# Patient Record
Sex: Male | Born: 1970 | Race: White | Hispanic: No | Marital: Single | Smoking: Former smoker
Health system: Southern US, Community
[De-identification: ages and names within clinical notes are randomized; demographics above are authoritative.]

---

## 2007-05-19 ENCOUNTER — Emergency Department (HOSPITAL_COMMUNITY): Admission: EM | Admit: 2007-05-19 | Discharge: 2007-05-20 | Payer: Self-pay | Admitting: Emergency Medicine

## 2007-05-31 ENCOUNTER — Ambulatory Visit: Payer: Self-pay | Admitting: *Deleted

## 2007-05-31 ENCOUNTER — Emergency Department (HOSPITAL_COMMUNITY): Admission: EM | Admit: 2007-05-31 | Discharge: 2007-05-31 | Payer: Self-pay | Admitting: Emergency Medicine

## 2007-05-31 ENCOUNTER — Ambulatory Visit: Admission: RE | Admit: 2007-05-31 | Discharge: 2007-05-31 | Payer: Self-pay | Admitting: Family Medicine

## 2008-12-02 ENCOUNTER — Inpatient Hospital Stay (HOSPITAL_COMMUNITY): Admission: EM | Admit: 2008-12-02 | Discharge: 2008-12-05 | Payer: Self-pay | Admitting: Family Medicine

## 2008-12-03 ENCOUNTER — Encounter (INDEPENDENT_AMBULATORY_CARE_PROVIDER_SITE_OTHER): Payer: Self-pay | Admitting: Internal Medicine

## 2008-12-03 ENCOUNTER — Ambulatory Visit: Payer: Self-pay | Admitting: Vascular Surgery

## 2011-03-21 LAB — DIFFERENTIAL
Basophils Absolute: 0.1 10*3/uL (ref 0.0–0.1)
Basophils Relative: 1 % (ref 0–1)
Eosinophils Absolute: 0.2 10*3/uL (ref 0.0–0.7)
Eosinophils Relative: 3 % (ref 0–5)
Lymphocytes Relative: 30 % (ref 12–46)
Lymphs Abs: 2.4 10*3/uL (ref 0.7–4.0)
Monocytes Absolute: 0.7 10*3/uL (ref 0.1–1.0)
Monocytes Relative: 8 % (ref 3–12)
Neutro Abs: 4.5 10*3/uL (ref 1.7–7.7)
Neutrophils Relative %: 58 % (ref 43–77)

## 2011-03-21 LAB — CBC
HCT: 39 % (ref 39.0–52.0)
Hemoglobin: 13.1 g/dL (ref 13.0–17.0)
MCHC: 33.5 g/dL (ref 30.0–36.0)
MCV: 93.7 fL (ref 78.0–100.0)
Platelets: 294 10*3/uL (ref 150–400)
RBC: 4.17 MIL/uL — ABNORMAL LOW (ref 4.22–5.81)
RDW: 12.2 % (ref 11.5–15.5)
WBC: 7.8 10*3/uL (ref 4.0–10.5)

## 2011-04-19 NOTE — Discharge Summary (Signed)
Richard Leonard, Richard Leonard        ACCOUNT NO.:  [id number]   MEDICAL RECORD NO.:  [id number]          PATIENT TYPE:  INP   LOCATION:  3006                         FACILITY:  [facility]   PHYSICIAN:  Charlestine Massed, MDDATE OF BIRTH:  Dec 17, 1970   DATE OF ADMISSION:  12/02/2008  DATE OF DISCHARGE:  12/05/2008                               DISCHARGE SUMMARY   PRIMARY CARE PHYSICIAN:  Unassigned to follow up with Dr. Dow Adolph at [place], [place].   REASON FOR ADMISSION:  Right lower leg rash with swelling.   HOSPITAL COURSE:  A 40 year old gentleman with no significant past  medical history and who recently has lost his health insurance came to  the emergency room complaining of a rash in the right lower leg.  It  started as a small rash with itching, and then developed all over the  right leg then associated with increasing swelling over the past few  days and warmth and pain.  On examination the patient denies having any  trauma on the leg.  Denies having any injuries, any procedures on the  leg.  Denied any fever, chest pain, shortness of breath, cough or sputum  production.  No chills, no night sweats.  There is no prior history of  any IV drug abuse suspected, and patient denied that.  He denied any  insect bite at that site too, and previously did not have any previous  episodes like this before.  The patient was admitted to the hospital.  Started on IV antibiotics, and examination revealed a diffuse rash all  over the right leg associated with swelling, and there was cellulitis in  the right lower extremity.  Rash looked like an ectopic dermatitis or  eczema kind of rash.  The patient was started on antihistamines as well  as IV antibiotics were switched over to p.o. antibiotics.  The patient  improved gradually.  No fevers, and pruritus decreased much.  After that  her blood cultures sent did not grow any organisms.  Switched over to  p.o. antibiotics, and  patient is being discharged home.   DISCHARGE DIAGNOSES:  1. Right lower leg cellulitis.  2. Possible dermatitis secondary to allergy or eczema.   DISCHARGE MEDICATIONS:  1. Augmentin 875 mg twice daily by mouth for 7 days.  2. Bactrim DS 1 tablet twice daily by mouth for 7 days.  3. Claritin 10 mg by mouth once daily for 14 days.  4. Daily dressing to the right lower extremity once daily.  5. Topical Neosporin ointment to the lower extremity twice daily.   CONSULTS DURING HOSPITAL STAY:  Wound care consult for advice was done.   ALLERGIES:  The patient does not have any drug allergies.   LABORATORIES AND TESTS:  The blood count on 1st of January 2010:  WBC  7.8, hemoglobin 13.1, hematocrit 39, and platelets 294.  There are no  bands.  BMP done on December 30:  Sodium 142, potassium 3.9, chloride  109, bicarb 28, glucose 96, BUN 6, creatinine 0.84.  Wound culture done  on December 29:  No wbc's seen, no organisms seen.  Cultures showed  moderate  Streptococcal group G.  Gram smear did not show any organisms.  Culture showed moderate Streptococcal group B and blood group G.  Blood  cultures both sets:  No organisms grown in cultures until today per  report.   DISCHARGE DISPOSITION:  Discharge back home.   FOLLOWUP:  Dr. Dow Adolph at [facility], phone number [phone].  Appointment fixed for January 06, 2009 at 8:45 a.m.  A dermatology  consult to be done at the discretion of Dr. Audria Nine at [facility];  therefore to be made by [facility] physician.   Total time spent for the discharge 30 minutes.      Charlestine Massed, MD  Electronically Signed     UT/MEDQ  D:  12/05/2008  T:  12/05/2008  Job:  [id number]   cc:   Maurice March, M.D.

## 2011-04-19 NOTE — H&P (Signed)
NAMELIONARDO, HAZE        ACCOUNT NO.:  [id number]   MEDICAL RECORD NO.:  [id number]          PATIENT TYPE:  INP   LOCATION:  3708                         FACILITY:  [facility]   PHYSICIAN:  Della Goo, M.D. DATE OF BIRTH:  1971-03-26   DATE OF ADMISSION:  12/02/2008  DATE OF DISCHARGE:                              HISTORY & PHYSICAL   PRIMARY CARE PHYSICIAN:  No unassigned   CHIEF COMPLAINT:  Right lower leg rash.   HISTORY OF PRESENT ILLNESS:  This is a 40 year old male presenting to  the emergency department with complaints of worsening of a rash on the  right lower leg.  He reports the rash began 2 months ago and has  continued to worsen.  He reports that the area started as a blistery  area.  He denies having any purulent drainage from this area.  He does  report at times it is pruritic.  He denies having any trauma to the area  or recalling an insect bite.  The patient denies having any previous  similar episodes to this.   PAST MEDICAL HISTORY:  None.   PAST SURGICAL HISTORY:  None.   ALLERGIES:  NO KNOWN DRUG ALLERGIES.   SOCIAL HISTORY:  The patient is a nonsmoker and nondrinker.   FAMILY HISTORY:  Noncontributory.   REVIEW OF SYSTEMS:  The patient denies having any fevers, chills, chest  pain, shortness of breath, cough, congestion, nausea, vomiting,  diarrhea, changes in his bowel or bladder habits, syncope, dizziness,  myalgias or arthralgias.   PHYSICAL EXAMINATION:  GENERAL:  This is a 40 year old overweight, well-  developed male in no discomfort or acute distress.  VITAL SIGNS:  Temperature 93, blood pressure 143/78, heart rate 84, respirations 16.  O2 saturation 94% on room air.  HEENT:  Normocephalic, atraumatic.  Pupils equally round and reactive to  light.  Extraocular movements are intact.  Funduscopic benign.  Oropharynx is clear.  NECK:  Supple, full range of motion.  No thyromegaly, adenopathy or  jugulovenous distention.   CARDIOVASCULAR:  Regular rate and rhythm.  No  murmurs, gallops or rubs.  LUNGS:  Clear to auscultation bilaterally.  ABDOMEN:  Positive bowel sounds.  Soft, nontender, nondistended.  EXTREMITIES:  The left lower extremity is without cyanosis, clubbing or  edema.  The right lower extremity at the distal aspect of the lower  portion of the right lower extremity distal third is a streaking  erythematous rash.  NEUROLOGIC:  Nonfocal.   LABORATORY STUDIES:  White blood cell count 9.0, hemoglobin 13.2,  hematocrit 39.1 and platelets 289.  Sodium 141, potassium 4.3, chloride  108, bicarb 24, BUN 21, creatinine 0.8, glucose 90.   ASSESSMENT:  A 40 year old male being admitted with cellulitis of the  right lower extremity.   PLAN:  The patient will be placed on IV antibiotic therapy of vancomycin  and Ancef.  DVT and GI prophylaxis will be ordered.  Also, the patient  will be placed on pain control therapy as needed.  Further evaluation  and workup will ensue pending results of his laboratory studies and his  clinical course.      Harvette  Lovell Sheehan, M.D.  Electronically Signed     HJ/MEDQ  D:  12/03/2008  T:  12/03/2008  Job:  [id number]

## 2011-09-09 LAB — CBC
HCT: 37.3 % — ABNORMAL LOW (ref 39.0–52.0)
HCT: 38.5 % — ABNORMAL LOW (ref 39.0–52.0)
HCT: 39.1 % (ref 39.0–52.0)
Hemoglobin: 12.7 g/dL — ABNORMAL LOW (ref 13.0–17.0)
Hemoglobin: 13 g/dL (ref 13.0–17.0)
Hemoglobin: 13.2 g/dL (ref 13.0–17.0)
MCHC: 33.7 g/dL (ref 30.0–36.0)
MCHC: 33.8 g/dL (ref 30.0–36.0)
MCHC: 34 g/dL (ref 30.0–36.0)
MCV: 94.2 fL (ref 78.0–100.0)
MCV: 94.5 fL (ref 78.0–100.0)
MCV: 95 fL (ref 78.0–100.0)
Platelets: 259 10*3/uL (ref 150–400)
Platelets: 282 10*3/uL (ref 150–400)
Platelets: 289 10*3/uL (ref 150–400)
RBC: 3.95 MIL/uL — ABNORMAL LOW (ref 4.22–5.81)
RBC: 4.06 MIL/uL — ABNORMAL LOW (ref 4.22–5.81)
RBC: 4.15 MIL/uL — ABNORMAL LOW (ref 4.22–5.81)
RDW: 12 % (ref 11.5–15.5)
RDW: 12.2 % (ref 11.5–15.5)
RDW: 12.2 % (ref 11.5–15.5)
WBC: 6.8 10*3/uL (ref 4.0–10.5)
WBC: 8.9 10*3/uL (ref 4.0–10.5)
WBC: 9 10*3/uL (ref 4.0–10.5)

## 2011-09-09 LAB — DIFFERENTIAL
Basophils Absolute: 0 10*3/uL (ref 0.0–0.1)
Basophils Absolute: 0 10*3/uL (ref 0.0–0.1)
Basophils Relative: 0 % (ref 0–1)
Basophils Relative: 1 % (ref 0–1)
Eosinophils Absolute: 0.2 10*3/uL (ref 0.0–0.7)
Eosinophils Absolute: 0.3 10*3/uL (ref 0.0–0.7)
Eosinophils Relative: 3 % (ref 0–5)
Eosinophils Relative: 3 % (ref 0–5)
Lymphocytes Relative: 18 % (ref 12–46)
Lymphocytes Relative: 28 % (ref 12–46)
Lymphs Abs: 1.6 10*3/uL (ref 0.7–4.0)
Lymphs Abs: 2.5 10*3/uL (ref 0.7–4.0)
Monocytes Absolute: 0.6 10*3/uL (ref 0.1–1.0)
Monocytes Absolute: 0.7 10*3/uL (ref 0.1–1.0)
Monocytes Relative: 7 % (ref 3–12)
Monocytes Relative: 8 % (ref 3–12)
Neutro Abs: 5.5 10*3/uL (ref 1.7–7.7)
Neutro Abs: 6.4 10*3/uL (ref 1.7–7.7)
Neutrophils Relative %: 62 % (ref 43–77)
Neutrophils Relative %: 72 % (ref 43–77)

## 2011-09-09 LAB — BASIC METABOLIC PANEL
BUN: 18 mg/dL (ref 6–23)
BUN: 6 mg/dL (ref 6–23)
CO2: 25 mEq/L (ref 19–32)
CO2: 28 mEq/L (ref 19–32)
Calcium: 8.5 mg/dL (ref 8.4–10.5)
Calcium: 8.6 mg/dL (ref 8.4–10.5)
Chloride: 109 mEq/L (ref 96–112)
Chloride: 112 mEq/L (ref 96–112)
Creatinine, Ser: 0.71 mg/dL (ref 0.4–1.5)
Creatinine, Ser: 0.84 mg/dL (ref 0.4–1.5)
GFR calc Af Amer: >60 mL/min (ref >60)
GFR calc Af Amer: >60 mL/min (ref >60)
GFR calc non Af Amer: >60 mL/min (ref >60)
GFR calc non Af Amer: >60 mL/min (ref >60)
Glucose, Bld: 87 mg/dL (ref 70–99)
Glucose, Bld: 96 mg/dL (ref 70–99)
Potassium: 3.9 mEq/L (ref 3.5–5.1)
Potassium: 3.9 mEq/L (ref 3.5–5.1)
Sodium: 141 mEq/L (ref 135–145)
Sodium: 142 mEq/L (ref 135–145)

## 2011-09-09 LAB — CULTURE, BLOOD (ROUTINE X 2)
Culture: NO GROWTH
Culture: NO GROWTH

## 2011-09-09 LAB — WOUND CULTURE: Gram Stain: NONE SEEN

## 2011-09-09 LAB — D-DIMER, QUANTITATIVE: D-Dimer, Quant: 0.68 ug/mL-FEU — ABNORMAL HIGH (ref 0.00–0.48)

## 2011-09-09 LAB — POCT I-STAT, CHEM 8
BUN: 21 mg/dL (ref 6–23)
Calcium, Ion: 1.06 mmol/L — ABNORMAL LOW (ref 1.12–1.32)
Chloride: 108 mEq/L (ref 96–112)
Creatinine, Ser: 0.8 mg/dL (ref 0.4–1.5)
Glucose, Bld: 90 mg/dL (ref 70–99)
HCT: 37 % — ABNORMAL LOW (ref 39.0–52.0)
Hemoglobin: 12.6 g/dL — ABNORMAL LOW (ref 13.0–17.0)
Potassium: 4.3 mEq/L (ref 3.5–5.1)
Sodium: 141 mEq/L (ref 135–145)
TCO2: 24 mmol/L (ref 0–100)

## 2011-09-09 LAB — TSH: TSH: 2.216 u[IU]/mL (ref 0.350–4.500)

## 2011-09-21 LAB — BASIC METABOLIC PANEL
BUN: 15
CO2: 26
Calcium: 9.2
Chloride: 105
Creatinine, Ser: 0.99
GFR calc Af Amer: >60
GFR calc non Af Amer: >60
Glucose, Bld: 102 — ABNORMAL HIGH
Potassium: 3.7
Sodium: 141

## 2011-09-21 LAB — D-DIMER, QUANTITATIVE: D-Dimer, Quant: 0.3

## 2021-01-20 ENCOUNTER — Other Ambulatory Visit: Payer: Self-pay | Admitting: Internal Medicine

## 2021-01-20 DIAGNOSIS — Z8249 Family history of ischemic heart disease and other diseases of the circulatory system: Secondary | ICD-10-CM

## 2021-01-25 ENCOUNTER — Ambulatory Visit (INDEPENDENT_AMBULATORY_CARE_PROVIDER_SITE_OTHER): Payer: 59

## 2021-01-25 ENCOUNTER — Other Ambulatory Visit: Payer: Self-pay

## 2021-01-25 ENCOUNTER — Ambulatory Visit (INDEPENDENT_AMBULATORY_CARE_PROVIDER_SITE_OTHER): Payer: 59 | Admitting: Family Medicine

## 2021-01-25 ENCOUNTER — Ambulatory Visit: Payer: Self-pay

## 2021-01-25 VITALS — BP 130/84 | HR 81 | Ht 66.0 in | Wt 237.0 lb

## 2021-01-25 DIAGNOSIS — M542 Cervicalgia: Secondary | ICD-10-CM | POA: Diagnosis not present

## 2021-01-25 DIAGNOSIS — G8929 Other chronic pain: Secondary | ICD-10-CM

## 2021-01-25 DIAGNOSIS — M25512 Pain in left shoulder: Secondary | ICD-10-CM | POA: Diagnosis not present

## 2021-01-25 NOTE — Progress Notes (Signed)
I, [name], LAT, ATC, am serving as scribe for Dr. [name].  Subjective:    CC: Left shoulder pain  HPI: Pt is a 50 y/o male c/o chronic L shoulder pain since 2005/06 when he injured himself while doing bench press.  He did not not have his shoulder evaluated at that time.  Pt locates pain to his L post shoulder w/ radiating pain into his L upper trap and neck.  Radiates: yes into his L upper trap and L lateral neck L shoulder mechanical symptoms: No Aggravates: end-range L shoulder AROM; push-ups; planks; L shoulder functional IR; L cervical rotation Treatments tried: Nothing  Pertinent review of Systems: No fevers or chills  Relevant historical information: Thoracolumbar fusion secondary to scoliosis surgery   Objective:    Vitals:   01/25/21 1440  BP: 130/84  Pulse: 81  SpO2: 96%   General: Well Developed, well nourished, and in no acute distress.   MSK: C-spine normal-appearing Significantly reduced cervical motion. Nontender midline. Tender palpation left cervical paraspinal musculature and trapezius. Upper extremity strength reflexes and sensation are equal normal throughout bilateral upper extremities Left shoulder normal-appearing Nontender. Normal motion. Strength is intact. Negative Hawkins and Neer's test.  Negative Yergason's and speeds test.  Lab and Radiology Results  X-ray images C-spine and left shoulder obtained today personally and independently interpreted  C-spine: Significant DDD C5-T1.  Thoracic spine hardware is intact no acute fractures.  Left shoulder: Mild glenohumeral DJD.  Mild AC DJD.  No acute fractures.  T-spine hardware intact  Await formal radiology review   Impression and Recommendations:    Assessment and Plan: 50 y.o. male with cervical paraspinal and trapezius pain.  Mostly muscle dysfunction and spasm however patient does certainly have a degenerative changes in his cervical spine that could be contributory.   I do not think the pain is related much to shoulder dysfunction itself.  Patient is a good candidate for physical therapy.  Plan for referral to PT.  Recheck back in about 6 weeks.  PDMP not reviewed this encounter. Orders Placed This Encounter  Procedures  . DG Cervical Spine 2 or 3 views    Standing Status:   Future    Number of Occurrences:   1    Standing Expiration Date:   01/25/2022    Order Specific Question:   Reason for Exam (SYMPTOM  OR DIAGNOSIS REQUIRED)    Answer:   eval neck pain.    Order Specific Question:   Preferred imaging location?    Answer:   [facility]  . DG Shoulder Left    Standing Status:   Future    Number of Occurrences:   1    Standing Expiration Date:   01/25/2022    Order Specific Question:   Reason for Exam (SYMPTOM  OR DIAGNOSIS REQUIRED)    Answer:   eval left shoudler pain    Order Specific Question:   Preferred imaging location?    Answer:   [facility]  . Ambulatory referral to Physical Therapy    Referral Priority:   Routine    Referral Type:   Physical Medicine    Referral Reason:   Specialty Services Required    Requested Specialty:   Physical Therapy   No orders of the defined types were placed in this encounter.   Discussed warning signs or symptoms. Please see discharge instructions. Patient expresses understanding.   The above documentation has been reviewed and is accurate and complete  Lynne Leader, M.D.

## 2021-01-25 NOTE — Patient Instructions (Signed)
Thank you for coming in today.  I've referred you to Physical Therapy.  Let us know if you don't hear from them in one week.  Please get an Xray today before you leave  Recheck in about 6 weeks.   Let me know if this is not working or you have a problem.

## 2021-01-27 NOTE — Progress Notes (Signed)
X-ray cervical spine shows a mild wedge shaped at the C7 vertebrae.  This could be due to an old compression fracture or because that is just how that vertebrae was made.  It does not look like a new compression fracture however and I do not think is causing new pain.  Additionally x-ray cervical spine shows arthritis.  Physical therapy is still the best option.  If not better will proceed with MRI which will tell us more.

## 2021-01-27 NOTE — Progress Notes (Signed)
X-ray left shoulder shows mild arthritis

## 2021-02-04 ENCOUNTER — Encounter: Payer: Self-pay | Admitting: Physical Therapy

## 2021-02-04 ENCOUNTER — Other Ambulatory Visit: Payer: Self-pay

## 2021-02-04 ENCOUNTER — Ambulatory Visit (INDEPENDENT_AMBULATORY_CARE_PROVIDER_SITE_OTHER): Payer: 59 | Admitting: Physical Therapy

## 2021-02-04 DIAGNOSIS — M542 Cervicalgia: Secondary | ICD-10-CM | POA: Diagnosis not present

## 2021-02-04 DIAGNOSIS — M25512 Pain in left shoulder: Secondary | ICD-10-CM

## 2021-02-04 DIAGNOSIS — R293 Abnormal posture: Secondary | ICD-10-CM

## 2021-02-04 NOTE — Therapy (Signed)
[facility] [address] [place], [place], [address] Phone: [phone]   Fax:  [phone]  Physical Therapy Evaluation  Patient Details  Name: Richard Leonard MRN: [id number] Date of Birth: [birth year] Referring Provider (PT): Rodolph Bong, MD   Encounter Date: 02/04/2021   PT End of Session - 02/04/21 1543    Visit Number 1    Number of Visits 12    Date for PT Re-Evaluation 03/18/21    Authorization Type [employer] 20% coinsurance    PT Start Time 1503    PT Stop Time 1542    PT Time Calculation (min) 39 min    Activity Tolerance Patient tolerated treatment well    Behavior During Therapy [facility] for tasks assessed/performed           History reviewed. No pertinent past medical history.  History reviewed. No pertinent surgical history.  There were no vitals filed for this visit.    Subjective Assessment - 02/04/21 1504    Subjective Pt is a 50 y/o male who presents to OPPT for neck and Lt shoulder pain.  Pt reports about 15 years ago he was at the gym doing a bench press and "did something stupid to my shoulder."  Since then he's had progressive pain and decreased function in Lt shoulder and neck.  He is unable to perform > 10 push ups without a burning sensation.    Pertinent History prior scoliosis surgery    Limitations Lifting;House hold activities    Diagnostic tests xrays: OA    Patient Stated Goals improve pain    Currently in Pain? Yes    Pain Score 7    up to 9/10; at best 0/10   Pain Location Neck   and shoulder   Pain Orientation Left    Pain Descriptors / Indicators Aching;Burning;Discomfort;Tightness    Pain Type Chronic pain    Pain Onset More than a month ago    Pain Frequency Intermittent    Aggravating Factors  unsure, lifting    Pain Relieving Factors deep breathing, relaxation, rest    Effect of Pain on Daily Activities difficulty sleeping              [facility] PT Assessment - 02/04/21 1509       Assessment   Medical Diagnosis M25.512,G89.29 (ICD-10-CM) - Chronic left shoulder pain  M54.2 (ICD-10-CM) - Neck pain    Referring Provider (PT) Rodolph Bong, MD    Onset Date/Surgical Date --   15 years ago   Hand Dominance Left    Next MD Visit 03/08/21    Prior Therapy none      Precautions   Precautions None      Restrictions   Weight Bearing Restrictions No      Balance Screen   Has the patient fallen in the past 6 months No    Has the patient had a decrease in activity level because of a fear of falling?  No    Is the patient reluctant to leave their home because of a fear of falling?  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; minimal travel, sedentary desk work    Leisure reading, sports; walking, coaching rec soccer      Cognition   Overall Cognitive Status Within Functional Limits for tasks assessed      Observation/Other Assessments   Focus on Therapeutic Outcomes (FOTO)  53 (predicted 58)      Posture/Postural Control   Posture/Postural Control Postural limitations    Postural Limitations Rounded Shoulders;Forward head      ROM / Strength   AROM / PROM / Strength AROM;Strength      AROM   Overall AROM Comments cervical ROM limited due to prior scoliosis surgery    AROM Assessment Site Cervical;Shoulder    Right/Left Shoulder Right;Left    Right Shoulder Flexion 160 Degrees    Right Shoulder ABduction 145 Degrees    Left Shoulder Flexion 124 Degrees    Left Shoulder ABduction 111 Degrees    Cervical Flexion 18    Cervical Extension 12   thoracic extension noted   Cervical - Right Side Bend 28    Cervical -  Left Side Bend 22    Cervical - Right Rotation 38    Cervical - Left Rotation 32      Strength   Strength Assessment Site Shoulder    Right/Left Shoulder Right;Left    Right Shoulder Flexion 5/5    Right Shoulder ABduction 5/5    Right Shoulder Internal Rotation 5/5    Right Shoulder External Rotation 4+/5    Left Shoulder Flexion 4+/5    Left Shoulder ABduction 4+/5    Left Shoulder Internal Rotation 5/5    Left Shoulder External Rotation 4+/5      Palpation   Spinal mobility pt with Herrington Rods    Palpation comment trigger points noted cervical paraspinals, upper trap and levator scapula on Lt      Special Tests   Other special tests unable to formally perform cervical tests due to prior surgery                      Objective measurements completed on examination: See above findings.       [facility] Adult PT Treatment/Exercise - 02/04/21 1509      Exercises   Exercises Other Exercises    Other Exercises  demonstrated HEP to pt, see pt instructions for details      Manual Therapy   Manual Therapy Soft tissue mobilization    Manual therapy comments skilled palpation and monitoring of soft tissue during DN    Soft tissue mobilization STM and compression to Lt upper trap and levator scapula            Trigger Point Dry Needling - 02/04/21 0001    Consent Given? Yes    Education Handout Provided Yes    Muscles Treated Head and Neck Upper trapezius;Levator scapulae    Upper Trapezius Response Twitch reponse elicited    Levator Scapulae Response Twitch response elicited                PT Education - 02/04/21 1458    Education Details HEP    Person(s) Educated Patient    Methods Explanation;Demonstration;Handout    Comprehension Returned demonstration;Verbalized understanding;Need further instruction            PT Short Term Goals - 02/04/21 1459      PT SHORT TERM GOAL #1   Title independent with initial HEP    Status New  Target  Date 02/25/21             PT Long Term Goals - 02/04/21 1459      PT LONG TERM GOAL #1   Title independent with final HEP    Status New    Target Date 03/18/21      PT LONG TERM GOAL #2   Title FOTO score improved to 58 for improved function    Status New    Target Date 03/18/21      PT LONG TERM GOAL #3   Title report pain < 4/10 with full work day for improved function and mobility    Status New    Target Date 03/18/21      PT LONG TERM GOAL #4   Title improve cervical rotation to at least 50 deg bil with pain < 3/10 for improved ability to drive    Status New    Target Date 03/18/21                  Plan - 02/04/21 1545    Clinical Impression Statement Pt is a 50 y/o male who presents to OPPT for chronic Lt sided neck and shoulder pain.  He demonstrates decreased ROM and strength as well as postural abnormalities and active trigger points affecting functional mobility.  Pt will benefit from PT to address deficits listed.    Personal Factors and Comorbidities Comorbidity 2;Time since onset of injury/illness/exacerbation    Comorbidities obesity, Herrington Rods    Examination-Activity Limitations Reach Overhead;Bed Mobility;Sleep;Caring for Others;Carry;Lift    Examination-Participation Restrictions Volunteer;Yard Work;Community Activity;Occupation;Driving    Stability/Clinical Decision Making Evolving/Moderate complexity    Clinical Decision Making Moderate    Rehab Potential Good    PT Frequency 2x / week    PT Duration 6 weeks    PT Treatment/Interventions ADLs/Self Care Home Management;Cryotherapy;Electrical Stimulation;Moist Heat;Therapeutic activities;Therapeutic exercise;Patient/family education;Neuromuscular re-education;Manual techniques;Passive range of motion;Dry needling;Taping    PT Next Visit Plan assess response to DN, review HEP, continue with posterior strengthening and manual/modalities PRN    PT Home Exercise Plan Access Code: [email or web address]     Consulted and Agree with Plan of Care Patient           Patient will benefit from skilled therapeutic intervention in order to improve the following deficits and impairments:  Pain,Postural dysfunction,Impaired tone,Increased muscle spasms,Increased fascial restricitons,Decreased range of motion,Decreased strength,Impaired UE functional use  Visit Diagnosis: Cervicalgia - Plan: PT plan of care cert/re-cert  Acute pain of left shoulder - Plan: PT plan of care cert/re-cert  Abnormal posture - Plan: PT plan of care cert/re-cert     Problem List There are no problems to display for this patient.     Clarita Crane, PT, DPT 02/04/21 3:53 PM    [facility] [address] [place], [place], [address] Phone: [phone]   Fax:  [phone]  Name: Starsky Nanna MRN: [id number] Date of Birth: [birth year]

## 2021-02-04 NOTE — Patient Instructions (Signed)
Access Code: [email or web address] URL: [email or web address] Date: 02/04/2021 Prepared by: Moshe Cipro  Exercises Standing Backward Shoulder Rolls - 2 x daily - 7 x weekly - 10 reps - 1 sets Seated Scapular Retraction - 2 x daily - 7 x weekly - 10 reps - 1 sets - 5 sec hold Shoulder External Rotation and Scapular Retraction - 2 x daily - 7 x weekly - 1 sets - 10 reps - 1-2 sec hold Doorway Pec Stretch at 90 Degrees Abduction - 2 x daily - 7 x weekly - 1 sets - 3 reps - 30 sec hold Supine Cervical Retraction with Towel - 2 x daily - 7 x weekly - 1 sets - 10 reps - 5 sec hold  Patient Education Trigger Point Dry Needling

## 2021-02-09 ENCOUNTER — Other Ambulatory Visit: Payer: Self-pay

## 2021-02-09 ENCOUNTER — Ambulatory Visit (INDEPENDENT_AMBULATORY_CARE_PROVIDER_SITE_OTHER): Payer: 59 | Admitting: Rehabilitative and Restorative Service Providers"

## 2021-02-09 ENCOUNTER — Encounter: Payer: Self-pay | Admitting: Rehabilitative and Restorative Service Providers"

## 2021-02-09 DIAGNOSIS — R293 Abnormal posture: Secondary | ICD-10-CM

## 2021-02-09 DIAGNOSIS — M542 Cervicalgia: Secondary | ICD-10-CM

## 2021-02-09 DIAGNOSIS — M25512 Pain in left shoulder: Secondary | ICD-10-CM

## 2021-02-09 NOTE — Patient Instructions (Signed)
Access Code: [email or web address] URL: [email or web address] Date: 02/09/2021 Prepared by: Chyrel Masson  Exercises Standing Backward Shoulder Rolls - 2 x daily - 7 x weekly - 10 reps - 1 sets Seated Scapular Retraction - 2 x daily - 7 x weekly - 10 reps - 1 sets - 5 sec hold Shoulder External Rotation and Scapular Retraction - 2 x daily - 7 x weekly - 1 sets - 10 reps - 1-2 sec hold Doorway Pec Stretch at 90 Degrees Abduction - 2 x daily - 7 x weekly - 1 sets - 3 reps - 30 sec hold Supine Cervical Retraction with Towel - 2 x daily - 7 x weekly - 1 sets - 10 reps - 5 sec hold Seated Upper Trapezius Stretch - 2 x daily - 7 x weekly - 1 sets - 5 reps - 15 hold  Patient Education Trigger Point Dry Needling

## 2021-02-09 NOTE — Therapy (Signed)
[facility] Physical Therapy [address] [place], [place], [address] Phone: [phone]   Fax:  [phone]  Physical Therapy Treatment  Patient Details  Name: Richard Leonard MRN: [id number] Date of Birth: [birth year] Referring Provider (PT): Rodolph Bong, MD   Encounter Date: 02/09/2021   PT End of Session - 02/09/21 1317    Visit Number 2    Number of Visits 12    Date for PT Re-Evaluation 03/18/21    Authorization Type [employer] 20% coinsurance    Progress Note Due on Visit 10    PT Start Time 1254    PT Stop Time 1332    PT Time Calculation (min) 38 min    Activity Tolerance Patient tolerated treatment well    Behavior During Therapy [facility] for tasks assessed/performed           History reviewed. No pertinent past medical history.  History reviewed. No pertinent surgical history.  There were no vitals filed for this visit.   Subjective Assessment - 02/09/21 1256    Subjective Pt. stated feeling stiffness in lower neck area.  Pt. stated he felt like like time helped loosen up some complaints. Twinges noted c movement.    Pertinent History prior scoliosis surgery    Limitations Lifting;House hold activities    Diagnostic tests xrays: OA    Patient Stated Goals improve pain    Currently in Pain? Yes    Pain Score 3     Pain Location Neck    Pain Orientation Right;Left    Pain Descriptors / Indicators Aching;Sore;Tightness    Pain Type Chronic pain    Pain Onset More than a month ago    Aggravating Factors  unsure.    Pain Relieving Factors evaluation intervention helped.                             [facility] Adult PT Treatment/Exercise - 02/09/21 0001      Exercises   Exercises Neck    Other Exercises  Review of HEP c demonstration, cues      Neck Exercises: Machines for Strengthening   UBE (Upper Arm Bike) Lvl 3.0 3 mins fwd/back each way around 50 rpm      Neck Exercises: Theraband   Shoulder Extension Green;20  reps    Rows Green;20 reps      Modalities   Modalities Moist Heat      Moist Heat Therapy   Number Minutes Moist Heat 5 Minutes   c stretching   Moist Heat Location Cervical   Lt     Manual Therapy   Manual therapy comments skilled palpation and monitoring of soft tissue during DN    Soft tissue mobilization compression to Lt upper trap            Trigger Point Dry Needling - 02/09/21 0001    Consent Given? Yes    Education Handout Provided Previously provided    Muscles Treated Head and Neck Upper trapezius;Levator scapulae    Upper Trapezius Response Twitch reponse elicited   Lt   Levator Scapulae Response Twitch response elicited   Lt               PT Education - 02/09/21 1307    Education Details Added strech for HEP as listed, review of HEP    Person(s) Educated Patient    Methods Explanation;Demonstration;Handout;Verbal cues    Comprehension Returned demonstration;Verbalized understanding  PT Short Term Goals - 02/09/21 1307      PT SHORT TERM GOAL #1   Title independent with initial HEP    Status On-going    Target Date 02/25/21             PT Long Term Goals - 02/04/21 1459      PT LONG TERM GOAL #1   Title independent with final HEP    Status New    Target Date 03/18/21      PT LONG TERM GOAL #2   Title FOTO score improved to 58 for improved function    Status New    Target Date 03/18/21      PT LONG TERM GOAL #3   Title report pain < 4/10 with full work day for improved function and mobility    Status New    Target Date 03/18/21      PT LONG TERM GOAL #4   Title improve cervical rotation to at least 50 deg bil with pain < 3/10 for improved ability to drive    Status New    Target Date 03/18/21                 Plan - 02/09/21 1315    Clinical Impression Statement Good overall tolerance to intervention today manuall for trigger point release.  Pt. reported positive initial results from first visit interventions.   Slight cuing need to improve HEP knowledge but good replication aftewards.  Continued mobility restriction noted in cervicothoracic region c myofascial restrictions c concordant symptoms noted.    Personal Factors and Comorbidities Comorbidity 2;Time since onset of injury/illness/exacerbation    Comorbidities obesity, Herrington Rods    Examination-Activity Limitations Reach Overhead;Bed Mobility;Sleep;Caring for Others;Carry;Lift    Examination-Participation Restrictions Volunteer;Yard Work;Community Activity;Occupation;Driving    Stability/Clinical Decision Making Evolving/Moderate complexity    Rehab Potential Good    PT Frequency 2x / week    PT Duration 6 weeks    PT Treatment/Interventions ADLs/Self Care Home Management;Cryotherapy;Electrical Stimulation;Moist Heat;Therapeutic activities;Therapeutic exercise;Patient/family education;Neuromuscular re-education;Manual techniques;Passive range of motion;Dry needling;Taping    PT Next Visit Plan DN/manual prn for symptom relief/ improved myofascial mobility, continue with posterior strengthening and manual/modalities PRN    PT Home Exercise Plan Access Code: [email or web address]    Consulted and Agree with Plan of Care Patient           Patient will benefit from skilled therapeutic intervention in order to improve the following deficits and impairments:  Pain,Postural dysfunction,Impaired tone,Increased muscle spasms,Increased fascial restricitons,Decreased range of motion,Decreased strength,Impaired UE functional use  Visit Diagnosis: Cervicalgia  Acute pain of left shoulder  Abnormal posture     Problem List There are no problems to display for this patient.  Chyrel Masson, PT, DPT, OCS, ATC 02/09/21  1:18 PM    [facility] Physical Therapy [address] [place], [place], [address] Phone: [phone]   Fax:  [phone]  Name: Richard Leonard MRN: [id number] Date of Birth: 05-22-1971

## 2021-02-10 ENCOUNTER — Ambulatory Visit
Admission: RE | Admit: 2021-02-10 | Discharge: 2021-02-10 | Disposition: A | Discharge status: Home / Self Care | Payer: No Typology Code available for payment source | Source: Ambulatory Visit | Attending: Internal Medicine | Admitting: Internal Medicine

## 2021-02-10 DIAGNOSIS — Z8249 Family history of ischemic heart disease and other diseases of the circulatory system: Secondary | ICD-10-CM

## 2021-02-16 ENCOUNTER — Other Ambulatory Visit: Payer: Self-pay

## 2021-02-16 ENCOUNTER — Encounter: Payer: Self-pay | Admitting: Rehabilitative and Restorative Service Providers"

## 2021-02-16 ENCOUNTER — Ambulatory Visit (INDEPENDENT_AMBULATORY_CARE_PROVIDER_SITE_OTHER): Payer: 59 | Admitting: Rehabilitative and Restorative Service Providers"

## 2021-02-16 DIAGNOSIS — M542 Cervicalgia: Secondary | ICD-10-CM | POA: Diagnosis not present

## 2021-02-16 DIAGNOSIS — R293 Abnormal posture: Secondary | ICD-10-CM | POA: Diagnosis not present

## 2021-02-16 DIAGNOSIS — M25512 Pain in left shoulder: Secondary | ICD-10-CM | POA: Diagnosis not present

## 2021-02-16 NOTE — Therapy (Addendum)
[facility] Physical Therapy [address] [place], [place], [address] Phone: [phone]   Fax:  [phone]  Physical Therapy Treatment  Patient Details  Name: Richard Leonard MRN: [id number] Date of Birth: 06-15-1971 Referring Provider (PT): Rodolph Bong, MD   Encounter Date: 02/16/2021   PT End of Session - 02/16/21 1425    Visit Number 3    Number of Visits 12    Date for PT Re-Evaluation 03/18/21    Authorization Type [employer] 20% coinsurance    Progress Note Due on Visit 10    PT Start Time 1425    PT Stop Time 1455    PT Time Calculation (min) 30 min    Activity Tolerance --   tightness post activity   Behavior During Therapy [facility] for tasks assessed/performed            02/19/21 1519  Symptoms/Limitations  Subjective Pt. indicated similar complaints overall.  Pertinent History prior scoliosis surgery  Limitations Lifting;House hold activities  Diagnostic tests xrays: OA  Patient Stated Goals improve pain  Pain Assessment  Currently in Pain? Yes  Pain Location Neck  Pain Orientation Left;Right  Pain Descriptors / Indicators Aching;Sore;Tightness  Pain Type Chronic pain  Pain Onset More than a month ago  Pain Frequency Constant  Aggravating Factors  exercise  Pain Relieving Factors unsure   There were no vitals filed for this visit.       [facility] PT Assessment - 02/16/21 0001      Assessment   Medical Diagnosis M25.512,G89.29 (ICD-10-CM) - Chronic left shoulder pain  M54.2 (ICD-10-CM) - Neck pain    Referring Provider (PT) Rodolph Bong, MD      Palpation   Palpation comment trigger points noted cervical paraspinals, upper trap and levator scapula on Lt                         [facility] Adult PT Treatment/Exercise - 02/16/21 0001      Neck Exercises: Machines for Strengthening   UBE (Upper Arm Bike) Lvl 3.0 4 mins fwd/back each way      Neck Exercises: Theraband   Shoulder Extension Green   2 x 15   Rows  Green   2 x 15   Shoulder External Rotation Green   bilateral, 2 x 15     Neck Exercises: Standing   Other Standing Exercises posterior shoulder rolls x 10, scap retraction 5 sec hold x 10      Manual Therapy   Manual therapy comments skilled palpation and monitoring of soft tissue during DN    Soft tissue mobilization compression to Lt upper trap            Trigger Point Dry Needling - 02/16/21 0001    Consent Given? Yes    Education Handout Provided Previously provided    Muscles Treated Head and Neck Upper trapezius   Lt   Upper Trapezius Response Twitch reponse elicited                PT Education - 02/16/21 1453    Education Details Education provided about impact of Lt UE fatigue/strength deficits compared to Rt as observed in clinical intervention as possible underlying factor that can contribute to symptom presentation.    Person(s) Educated Patient    Methods Explanation    Comprehension Verbalized understanding            PT Short Term Goals - 02/09/21 1307  PT SHORT TERM GOAL #1   Title independent with initial HEP    Status On-going    Target Date 02/25/21             PT Long Term Goals - 02/04/21 1459      PT LONG TERM GOAL #1   Title independent with final HEP    Status New    Target Date 03/18/21      PT LONG TERM GOAL #2   Title FOTO score improved to 58 for improved function    Status New    Target Date 03/18/21      PT LONG TERM GOAL #3   Title report pain < 4/10 with full work day for improved function and mobility    Status New    Target Date 03/18/21      PT LONG TERM GOAL #4   Title improve cervical rotation to at least 50 deg bil with pain < 3/10 for improved ability to drive    Status New    Target Date 03/18/21                 Plan - 02/16/21 1440    Clinical Impression Statement Pt. had continued complaints of tightness in Lt side of neck, noting tense and tightness in area.  Positive concordant symptoms  continued c TrP palpation, DN intervention for Lt upper trap.  Lt UE weakness and fatigue intervention paired c mobility deficits on the whole for cervicothoracic spine factor into presentation.    Personal Factors and Comorbidities Comorbidity 2;Time since onset of injury/illness/exacerbation    Comorbidities obesity, Herrington Rods    Examination-Activity Limitations Reach Overhead;Bed Mobility;Sleep;Caring for Others;Carry;Lift    Examination-Participation Restrictions Volunteer;Yard Work;Community Activity;Occupation;Driving    Stability/Clinical Decision Making Evolving/Moderate complexity    Rehab Potential Good    PT Frequency 2x / week    PT Duration 6 weeks    PT Treatment/Interventions ADLs/Self Care Home Management;Cryotherapy;Electrical Stimulation;Moist Heat;Therapeutic activities;Therapeutic exercise;Patient/family education;Neuromuscular re-education;Manual techniques;Passive range of motion;Dry needling;Taping    PT Next Visit Plan DN/manual prn for symptom relief/ improved myofascial mobility, continue with posterior strengthening and manual/modalities PRN    PT Home Exercise Plan Access Code: [email or web address]    Consulted and Agree with Plan of Care Patient           Patient will benefit from skilled therapeutic intervention in order to improve the following deficits and impairments:  Pain,Postural dysfunction,Impaired tone,Increased muscle spasms,Increased fascial restricitons,Decreased range of motion,Decreased strength,Impaired UE functional use  Visit Diagnosis: Cervicalgia  Acute pain of left shoulder  Abnormal posture     Problem List There are no problems to display for this patient.   Chyrel Masson, PT, DPT, OCS, ATC 02/16/21  2:56 PM   Subjective added  Chyrel Masson, PT, DPT, OCS, ATC 02/19/21  3:25 PM       [facility] Physical Therapy [address] [place], [place], [address] Phone: [phone]   Fax:  [phone]  Name:  Richard Leonard MRN: [id number] Date of Birth: 1971/11/05

## 2021-02-18 ENCOUNTER — Other Ambulatory Visit: Payer: Self-pay

## 2021-02-18 ENCOUNTER — Ambulatory Visit (INDEPENDENT_AMBULATORY_CARE_PROVIDER_SITE_OTHER): Payer: 59 | Admitting: Pulmonary Disease

## 2021-02-18 ENCOUNTER — Encounter: Payer: Self-pay | Admitting: Pulmonary Disease

## 2021-02-18 VITALS — BP 122/70 | HR 85 | Temp 97.3°F | Ht 66.0 in | Wt 235.2 lb

## 2021-02-18 DIAGNOSIS — G4733 Obstructive sleep apnea (adult) (pediatric): Secondary | ICD-10-CM

## 2021-02-18 NOTE — Patient Instructions (Signed)
Will get in touch with your doctors office to get a name for DME company  You may call them, if you find the number on the machine to try and troubleshoot the machine  If the machine is broken and they need a new prescription, that can be provided   Weight loss efforts  Try to sleep on your side, elevation of the elevated bed will help snoring  Call with significant concerns  We will see you in 6 to 8 weeks

## 2021-02-18 NOTE — Progress Notes (Signed)
Richard Leonard    [id number]    1971/03/20  Primary Care Physician:Pharr, Zollie Beckers, MD  Referring Physician: Merri Brunette, MD [address] [place],  [place] [address]  Chief complaint:   Patient was diagnosed with obstructive sleep apnea about a year ago and has been using CPAP Machine is dysfunctional  HPI:  Has been having issues with his machine, machine keeps shutting off He has only have the machine for about a year Diagnosed with moderate obstructive sleep apnea Study is not available for me to review He is not aware of his DME company  Usually goes to bed between 11 PM on 1 AM Takes about 30 minutes to fall asleep 2-3 awakenings Wakes up finally about 7:53 AM  Weight has been stable  Has a lot of allergies for which he is currently being evaluated Has a lot of nasal stuffiness We will be hopefully starting allergy shots soon  Denies morning headaches Occasional night sweats if room is warm Denies any significant dryness of his mouth   Reformed smoker  Outpatient Encounter Medications as of 02/18/2021  Medication Sig  . Multiple Vitamin (MULTIVITAMIN WITH MINERALS) TABS tablet Take 1 tablet by mouth daily.  . Omega-3 Fatty Acids (FISH OIL) 1000 MG CAPS Take by mouth.  . Semaglutide (OZEMPIC, 1 MG/DOSE, [place]) Inject 1 mg into the skin once a week.  . vitamin C (ASCORBIC ACID) 250 MG tablet Take 250 mg by mouth daily.  . Zinc 100 MG TABS Take by mouth.  . [DISCONTINUED] Multiple Vitamin (MULTI VITAMIN) TABS 1 tablet   No facility-administered encounter medications on file as of 02/18/2021.    Allergies as of 02/18/2021  . (No Known Allergies)    History reviewed. No pertinent past medical history.  History reviewed. No pertinent surgical history.  History reviewed. No pertinent family history.  Social History   Socioeconomic History  . Marital status: Single    Spouse name: Not on file  . Number of children: Not on file   . Years of education: Not on file  . Highest education level: Not on file  Occupational History  . Not on file  Tobacco Use  . Smoking status: Former Smoker    Packs/day: 1.00    Quit date: 2020    Years since quitting: 2.2  . Smokeless tobacco: Never Used  Substance and Sexual Activity  . Alcohol use: Not on file  . Drug use: Not on file  . Sexual activity: Not on file  Other Topics Concern  . Not on file  Social History Narrative  . Not on file   Social Determinants of Health   Financial Resource Strain: Not on file  Food Insecurity: Not on file  Transportation Needs: Not on file  Physical Activity: Not on file  Stress: Not on file  Social Connections: Not on file  Intimate Partner Violence: Not on file    Review of Systems  Constitutional: Positive for fatigue.  Respiratory: Positive for apnea.   Psychiatric/Behavioral: Positive for sleep disturbance.    Vitals:   02/18/21 1605  BP: 122/70  Pulse: 85  Temp: (!) 97.3 F (36.3 C)  SpO2: 96%     Physical Exam Constitutional:      Appearance: He is obese.  HENT:     Head: Normocephalic and atraumatic.     Nose: No congestion.     Mouth/Throat:     Mouth: Mucous membranes are moist.  Eyes:  Pupils: Pupils are equal, round, and reactive to light.  Cardiovascular:     Rate and Rhythm: Normal rate and regular rhythm.     Heart sounds: No murmur heard. No friction rub.  Pulmonary:     Effort: No respiratory distress.     Breath sounds: No stridor. No wheezing or rhonchi.  Musculoskeletal:     Cervical back: No rigidity or tenderness.  Neurological:     Mental Status: He is alert.  Psychiatric:        Mood and Affect: Mood normal.   Epworth sleepiness scale of 8  Data Reviewed: Previous study not available none  Assessment:  Excessive daytime sleepiness  Moderate obstructive sleep apnea  Obesity  Pathophysiology of sleep disordered breathing discussed Treatment options  discussed  Plan/Recommendations: We will try and find out who his DME company is by calling his primary care doctor's office  Encourage patient to look on the machine to see visit number to call regarding his DME as well  The machine is only a year old, it should be evaluated and if defective will need a new device  He has significant symptoms suggesting continuing sleep disordered breathing that remains untreated at present  Tentative follow-up in 4 to 6 weeks   Virl Diamond MD [facility] 02/18/2021, 4:22 PM  CC: Merri Brunette, MD

## 2021-02-19 ENCOUNTER — Encounter: Payer: Self-pay | Admitting: Rehabilitative and Restorative Service Providers"

## 2021-02-19 ENCOUNTER — Ambulatory Visit (INDEPENDENT_AMBULATORY_CARE_PROVIDER_SITE_OTHER): Payer: 59 | Admitting: Rehabilitative and Restorative Service Providers"

## 2021-02-19 DIAGNOSIS — M542 Cervicalgia: Secondary | ICD-10-CM | POA: Diagnosis not present

## 2021-02-19 DIAGNOSIS — M25512 Pain in left shoulder: Secondary | ICD-10-CM

## 2021-02-19 DIAGNOSIS — R293 Abnormal posture: Secondary | ICD-10-CM

## 2021-02-19 NOTE — Therapy (Signed)
[facility] Physical Therapy [address] [place], [place], [address] Phone: [phone]   Fax:  [phone]  Physical Therapy Treatment  Patient Details  Name: Richard Leonard MRN: [id number] Date of Birth: 1971-11-09 Referring Provider (PT): Rodolph Bong, MD   Encounter Date: 02/19/2021   PT End of Session - 02/19/21 1550    Visit Number 4    Number of Visits 12    Date for PT Re-Evaluation 03/18/21    Authorization Type [employer] 20% coinsurance    Progress Note Due on Visit 10    PT Start Time 1500    PT Stop Time 1550    PT Time Calculation (min) 50 min    Activity Tolerance Patient tolerated treatment well   tightness post activity   Behavior During Therapy [facility] for tasks assessed/performed           History reviewed. No pertinent past medical history.  History reviewed. No pertinent surgical history.  There were no vitals filed for this visit.   Subjective Assessment - 02/19/21 1542    Subjective Pt. indicated feeling similar complaints overall.  Pt. reported concern about whether he needed to check imaging for nerve related symptoms.  Had contiued questions about why his Lt side was weaker.    Pertinent History prior scoliosis surgery    Limitations Lifting;House hold activities    Diagnostic tests xrays: OA    Patient Stated Goals improve pain    Currently in Pain? Yes    Pain Score 3    was reported similar to last visit   Pain Location Neck    Pain Orientation Left    Pain Descriptors / Indicators Aching;Tightness;Sore    Pain Type Chronic pain    Pain Radiating Towards shoulder    Pain Onset More than a month ago    Pain Frequency Constant    Aggravating Factors  exercise    Pain Relieving Factors short term from treatment, sunre for long term              [facility] PT Assessment - 02/19/21 0001      Assessment   Medical Diagnosis M25.512,G89.29 (ICD-10-CM) - Chronic left shoulder pain  M54.2 (ICD-10-CM) - Neck pain     Referring Provider (PT) Rodolph Bong, MD      Strength   Left Shoulder Flexion 5/5    Left Shoulder ABduction 4+/5    Left Shoulder Internal Rotation 5/5    Left Shoulder External Rotation 4+/5                         [facility] Adult PT Treatment/Exercise - 02/19/21 0001      Neck Exercises: Theraband   Shoulder Extension Green   3 x 10   Rows Green   3 x 10   Shoulder External Rotation Green   2x 15   Shoulder Internal Rotation Green   2x 15     Modalities   Modalities Geologist, engineering Location Lt neck/shoulder    Electrical Stimulation Action IFC    Electrical Stimulation Parameters to tolerance    Electrical Stimulation Goals Pain      Manual Therapy   Manual therapy comments skilled palpation and monitoring of soft tissue during DN    Soft tissue mobilization compression to Lt upper trap            Trigger Point Dry Needling - 02/19/21  0001    Consent Given? Yes    Education Handout Provided Previously provided    Muscles Treated Head and Neck Upper trapezius    Upper Trapezius Response Twitch reponse elicited                PT Education - 02/19/21 1543    Education Details Education provided about impact of Lt UE fatigue/strength deficits compared to Rt as observed in clinical intervention as possible underlying factor that can contribute to symptom presentation.    Person(s) Educated Patient    Methods Explanation    Comprehension Verbalized understanding            PT Short Term Goals - 02/09/21 1307      PT SHORT TERM GOAL #1   Title independent with initial HEP    Status On-going    Target Date 02/25/21             PT Long Term Goals - 02/04/21 1459      PT LONG TERM GOAL #1   Title independent with final HEP    Status New    Target Date 03/18/21      PT LONG TERM GOAL #2   Title FOTO score improved to 58 for improved function    Status New    Target Date  03/18/21      PT LONG TERM GOAL #3   Title report pain < 4/10 with full work day for improved function and mobility    Status New    Target Date 03/18/21      PT LONG TERM GOAL #4   Title improve cervical rotation to at least 50 deg bil with pain < 3/10 for improved ability to drive    Status New    Target Date 03/18/21                 Plan - 02/19/21 1545    Clinical Impression Statement Continued strength deficits and fatigue in Lt shoulder at this time.  Complaints of tightness in cervical region myofascially that correlate to presentation of concordant symptoms.  Overall cervical and thoracic postural mobility limitations play a role in presentation.    Personal Factors and Comorbidities Comorbidity 2;Time since onset of injury/illness/exacerbation    Comorbidities obesity, Herrington Rods    Examination-Activity Limitations Reach Overhead;Bed Mobility;Sleep;Caring for Others;Carry;Lift    Examination-Participation Restrictions Volunteer;Yard Work;Community Activity;Occupation;Driving    Stability/Clinical Decision Making Evolving/Moderate complexity    Rehab Potential Good    PT Frequency 2x / week    PT Duration 6 weeks    PT Treatment/Interventions ADLs/Self Care Home Management;Cryotherapy;Electrical Stimulation;Moist Heat;Therapeutic activities;Therapeutic exercise;Patient/family education;Neuromuscular re-education;Manual techniques;Passive range of motion;Dry needling;Taping    PT Next Visit Plan Reassess strength comparision c dynamometry, reevalutate use of DN based off patient preferences.  Check for lack of progress indications?    PT Home Exercise Plan Access Code: [email or web address]    Consulted and Agree with Plan of Care Patient           Patient will benefit from skilled therapeutic intervention in order to improve the following deficits and impairments:  Pain,Postural dysfunction,Impaired tone,Increased muscle spasms,Increased fascial restricitons,Decreased range of  motion,Decreased strength,Impaired UE functional use  Visit Diagnosis: Cervicalgia  Acute pain of left shoulder  Abnormal posture     Problem List There are no problems to display for this patient.   Chyrel Masson, PT, DPT, OCS, ATC 02/19/21  3:55 PM    [facility] Physical Therapy [phone] [place]  [address] [place], [place], [address] Phone: [phone]   Fax:  [phone]  Name: Richard Leonard MRN: [id number] Date of Birth: Sep 13, 1971

## 2021-02-22 ENCOUNTER — Encounter: Payer: Self-pay | Admitting: Physical Therapy

## 2021-02-22 ENCOUNTER — Ambulatory Visit (INDEPENDENT_AMBULATORY_CARE_PROVIDER_SITE_OTHER): Payer: 59 | Admitting: Physical Therapy

## 2021-02-22 ENCOUNTER — Other Ambulatory Visit: Payer: Self-pay

## 2021-02-22 DIAGNOSIS — R293 Abnormal posture: Secondary | ICD-10-CM | POA: Diagnosis not present

## 2021-02-22 DIAGNOSIS — M25512 Pain in left shoulder: Secondary | ICD-10-CM

## 2021-02-22 DIAGNOSIS — M542 Cervicalgia: Secondary | ICD-10-CM | POA: Diagnosis not present

## 2021-02-22 NOTE — Patient Instructions (Signed)
Access Code: [email or web address] URL: [email or web address] Date: 02/22/2021 Prepared by: Moshe Cipro  Exercises Standing Backward Shoulder Rolls - 2 x daily - 7 x weekly - 10 reps - 1 sets Seated Scapular Retraction - 2 x daily - 7 x weekly - 10 reps - 1 sets - 5 sec hold Shoulder External Rotation and Scapular Retraction - 2 x daily - 7 x weekly - 1 sets - 10 reps - 1-2 sec hold Doorway Pec Stretch at 90 Degrees Abduction - 2 x daily - 7 x weekly - 1 sets - 3 reps - 30 sec hold Supine Cervical Retraction with Towel - 2 x daily - 7 x weekly - 1 sets - 10 reps - 5 sec hold Seated Upper Trapezius Stretch - 2 x daily - 7 x weekly - 1 sets - 5 reps - 15 hold Standing Shoulder Flexion to 90 Degrees with Dumbbells - 1 x daily - 7 x weekly - 1 sets - 3 reps - 30 sec hold Shoulder Abduction with Dumbbells - Thumbs Up - 1 x daily - 7 x weekly - 1 sets - 3 reps - 30 sec hold Shoulder Overhead Press in Abduction with Dumbbells - 1 x daily - 7 x weekly - 3 sets - 10 reps  Patient Education Trigger Point Dry Needling

## 2021-02-22 NOTE — Therapy (Signed)
[facility] Physical Therapy [address] [place], [place], [address] Phone: [phone]   Fax:  [phone]  Physical Therapy Treatment  Patient Details  Name: Richard Leonard MRN: [id number] Date of Birth: June 29, 1971 Referring Provider (PT): Rodolph Bong, MD   Encounter Date: 02/22/2021   PT End of Session - 02/22/21 1056    Visit Number 5    Number of Visits 12    Date for PT Re-Evaluation 03/18/21    Authorization Type [employer] 20% coinsurance    Progress Note Due on Visit 10    PT Start Time 0845    PT Stop Time 0935    PT Time Calculation (min) 50 min    Activity Tolerance Patient tolerated treatment well   tightness post activity   Behavior During Therapy [facility] for tasks assessed/performed           History reviewed. No pertinent past medical history.  History reviewed. No pertinent surgical history.  There were no vitals filed for this visit.   Subjective Assessment - 02/22/21 0846    Subjective feels like tightness and symptoms are about the same.  not sure what has helped, but globally feels a little better.    Pertinent History prior scoliosis surgery    Limitations Lifting;House hold activities    Diagnostic tests xrays: OA    Patient Stated Goals improve pain    Currently in Pain? No/denies              [facility] PT Assessment - 02/22/21 0849      Assessment   Medical Diagnosis M25.512,G89.29 (ICD-10-CM) - Chronic left shoulder pain  M54.2 (ICD-10-CM) - Neck pain    Referring Provider (PT) Rodolph Bong, MD      Strength   Overall Strength Comments dynamometer with avg of 3 trials    Right Shoulder Flexion --   35.87   Right Shoulder ABduction --   23.63   Right Shoulder External Rotation --   26.83   Left Shoulder Flexion --   30.27   Left Shoulder ABduction --   28.9   Left Shoulder External Rotation --   35.33                        [facility] Adult PT Treatment/Exercise - 02/22/21 0858      Neck  Exercises: Standing   Other Standing Exercises shoulder press 3x10; Lt; 3#    Other Standing Exercises sustained hold at 90 deg flexion/abdct 3x30 sec holds (2-3#); Left      Moist Heat Therapy   Number Minutes Moist Heat 15 Minutes    Moist Heat Location Cervical      Electrical Stimulation   Electrical Stimulation Location Lt neck/shoulder    Electrical Stimulation Action IFC    Electrical Stimulation Parameters to tolerance    Electrical Stimulation Goals Pain      Manual Therapy   Manual therapy comments skilled palpation and monitoring of soft tissue during DN    Soft tissue mobilization compression to Lt upper trap and levator scapula      Neck Exercises: Stretches   Other Neck Stretches doorway stretch 3x30 sec; mid level            Trigger Point Dry Needling - 02/22/21 0925    Consent Given? Yes    Education Handout Provided Previously provided    Muscles Treated Head and Neck Levator scapulae;Upper trapezius    Upper Trapezius Response Twitch reponse elicited  Levator Scapulae Response Twitch response elicited                PT Education - 02/22/21 0927    Education Details added to HEP    Person(s) Educated Patient    Methods Explanation;Demonstration;Handout    Comprehension Verbalized understanding;Returned demonstration;Need further instruction            PT Short Term Goals - 02/09/21 1307      PT SHORT TERM GOAL #1   Title independent with initial HEP    Status On-going    Target Date 02/25/21             PT Long Term Goals - 02/04/21 1459      PT LONG TERM GOAL #1   Title independent with final HEP    Status New    Target Date 03/18/21      PT LONG TERM GOAL #2   Title FOTO score improved to 58 for improved function    Status New    Target Date 03/18/21      PT LONG TERM GOAL #3   Title report pain < 4/10 with full work day for improved function and mobility    Status New    Target Date 03/18/21      PT LONG TERM GOAL #4    Title improve cervical rotation to at least 50 deg bil with pain < 3/10 for improved ability to drive    Status New    Target Date 03/18/21                 Plan - 02/22/21 1056    Clinical Impression Statement Pt continues to report continued fatigue with Lt shoulder with pronlonged activity.  Dynamometer testing demonstrated decreased Lt shoulder flexion strength but other shoulder motions stronger than Rt.  Added long duration holds to HEP to work on muscle endurance.  Will continue to benefit from PT to maximize function.    Personal Factors and Comorbidities Comorbidity 2;Time since onset of injury/illness/exacerbation    Comorbidities obesity, Herrington Rods    Examination-Activity Limitations Reach Overhead;Bed Mobility;Sleep;Caring for Others;Carry;Lift    Examination-Participation Restrictions Volunteer;Yard Work;Community Activity;Occupation;Driving    Stability/Clinical Decision Making Evolving/Moderate complexity    Rehab Potential Good    PT Frequency 2x / week    PT Duration 6 weeks    PT Treatment/Interventions ADLs/Self Care Home Management;Cryotherapy;Electrical Stimulation;Moist Heat;Therapeutic activities;Therapeutic exercise;Patient/family education;Neuromuscular re-education;Manual techniques;Passive range of motion;Dry needling;Taping    PT Next Visit Plan Reassess strength comparision c dynamometry, reevalutate use of DN based off patient preferences.  see how new HEP is going, check STG    PT Home Exercise Plan Access Code: [email or web address]    Consulted and Agree with Plan of Care Patient           Patient will benefit from skilled therapeutic intervention in order to improve the following deficits and impairments:  Pain,Postural dysfunction,Impaired tone,Increased muscle spasms,Increased fascial restricitons,Decreased range of motion,Decreased strength,Impaired UE functional use  Visit Diagnosis: Cervicalgia  Acute pain of left shoulder  Abnormal  posture     Problem List There are no problems to display for this patient.     Clarita Crane, PT, DPT 02/22/21 10:58 AM     [facility] Physical Therapy [address] [place], [place], [address] Phone: [phone]   Fax:  [phone]  Name: Richard Leonard MRN: [id number] Date of Birth: [birth year]

## 2021-02-24 ENCOUNTER — Encounter: Payer: 59 | Admitting: Rehabilitative and Restorative Service Providers"

## 2021-02-26 ENCOUNTER — Encounter: Payer: 59 | Admitting: Physical Therapy

## 2021-02-26 ENCOUNTER — Telehealth: Payer: Self-pay | Admitting: Physical Therapy

## 2021-02-26 NOTE — Telephone Encounter (Signed)
LVM for pt about scheduled appt today and reminded of next scheduled appt.  Advised to call office if needed.  Clarita Crane, PT, DPT 02/26/21 9:04 AM

## 2021-03-01 ENCOUNTER — Encounter: Payer: Self-pay | Admitting: Rehabilitative and Restorative Service Providers"

## 2021-03-01 ENCOUNTER — Ambulatory Visit (INDEPENDENT_AMBULATORY_CARE_PROVIDER_SITE_OTHER): Payer: 59 | Admitting: Rehabilitative and Restorative Service Providers"

## 2021-03-01 ENCOUNTER — Other Ambulatory Visit: Payer: Self-pay

## 2021-03-01 DIAGNOSIS — M542 Cervicalgia: Secondary | ICD-10-CM | POA: Diagnosis not present

## 2021-03-01 DIAGNOSIS — R293 Abnormal posture: Secondary | ICD-10-CM | POA: Diagnosis not present

## 2021-03-01 DIAGNOSIS — M25512 Pain in left shoulder: Secondary | ICD-10-CM | POA: Diagnosis not present

## 2021-03-01 NOTE — Therapy (Signed)
[facility] Therapy [address] [place], [place], [address] Phone: [phone]   Fax:  [phone]  Physical Therapy Treatment  Patient Details  Name: Richard Leonard MRN: [id number] Date of Birth: January 05, 1971 Referring Provider (PT): Rodolph Bong, MD   Encounter Date: 03/01/2021   PT End of Session - 03/01/21 1615    Visit Number 6    Number of Visits 12    Date for PT Re-Evaluation 03/18/21    Authorization Type [employer] 20% coinsurance    Progress Note Due on Visit 10    PT Start Time 1545    PT Stop Time 1624    PT Time Calculation (min) 39 min    Activity Tolerance Patient tolerated treatment well   tightness post activity   Behavior During Therapy [facility] for tasks assessed/performed           History reviewed. No pertinent past medical history.  History reviewed. No pertinent surgical history.  There were no vitals filed for this visit.   Subjective Assessment - 03/01/21 1550    Subjective Pt. indicated feeling like last week was fine.  Had some trouble throwing ball in kickball and some tightness in back of shoulder.    Pertinent History prior scoliosis surgery    Limitations Lifting;House hold activities    Diagnostic tests xrays: OA    Patient Stated Goals improve pain    Currently in Pain? Yes    Pain Score 5     Pain Location Shoulder    Pain Orientation Left    Pain Descriptors / Indicators Sharp    Pain Frequency Intermittent    Aggravating Factors  throwing, reaching out to side    Pain Relieving Factors rest                             [facility] Adult PT Treatment/Exercise - 03/01/21 0001      Neck Exercises: Machines for Strengthening   Cybex Row 25 lbs 3 x 10    Cybex Chest Press 45 lbs 3 x 10      Neck Exercises: Theraband   Shoulder External Rotation Green   3 x 10 c towel roll   Horizontal ABduction Green   3 x 10 standing     Neck Exercises: Standing   Other Standing Exercises  superset flexion, abd each  to 90 deg 3 x 10 (x 10 bilatera, hold c 10x contralterally each UE, 10 together) 3 lbs      Manual Therapy   Soft tissue mobilization compression to Lt infraspinatus medially      Neck Exercises: Stretches   Other Neck Stretches doorway stretch 90 deg 30 sec x 2    Other Neck Stretches cross arm stretch 30 sec x2            Trigger Point Dry Needling - 03/01/21 0001    Consent Given? Yes    Education Handout Provided Previously provided    Muscles Treated Upper Quadrant Infraspinatus   Lt   Infraspinatus Response Twitch response elicited                  PT Short Term Goals - 03/01/21 1616      PT SHORT TERM GOAL #1   Title independent with initial HEP    Status Achieved    Target Date 02/25/21             PT Long Term Goals -  02/04/21 1459      PT LONG TERM GOAL #1   Title independent with final HEP    Status New    Target Date 03/18/21      PT LONG TERM GOAL #2   Title FOTO score improved to 58 for improved function    Status New    Target Date 03/18/21      PT LONG TERM GOAL #3   Title report pain < 4/10 with full work day for improved function and mobility    Status New    Target Date 03/18/21      PT LONG TERM GOAL #4   Title improve cervical rotation to at least 50 deg bil with pain < 3/10 for improved ability to drive    Status New    Target Date 03/18/21                 Plan - 03/01/21 1618    Clinical Impression Statement Improved tolerance to intervention today.  Fatigue still evident on a few exercises (chest press, ER for example).  Continued progression in functional movement to be beneficial.  Treated infraspinatus TrP that seemed to produced concordant symptoms in posterior shoulder today.   In general, reduced severity of tightness reported throughout visit.    Personal Factors and Comorbidities Comorbidity 2;Time since onset of injury/illness/exacerbation    Comorbidities obesity, Herrington Rods     Examination-Activity Limitations Reach Overhead;Bed Mobility;Sleep;Caring for Others;Carry;Lift    Examination-Participation Restrictions Volunteer;Yard Work;Community Activity;Occupation;Driving    Stability/Clinical Decision Making Evolving/Moderate complexity    Rehab Potential Good    PT Frequency 2x / week    PT Duration 6 weeks    PT Treatment/Interventions ADLs/Self Care Home Management;Cryotherapy;Electrical Stimulation;Moist Heat;Therapeutic activities;Therapeutic exercise;Patient/family education;Neuromuscular re-education;Manual techniques;Passive range of motion;Dry needling;Taping    PT Next Visit Plan Reassessment next visit (strength, mobility) for MD visit.    PT Home Exercise Plan Access Code: [email or web address]    Consulted and Agree with Plan of Care Patient           Patient will benefit from skilled therapeutic intervention in order to improve the following deficits and impairments:  Pain,Postural dysfunction,Impaired tone,Increased muscle spasms,Increased fascial restricitons,Decreased range of motion,Decreased strength,Impaired UE functional use  Visit Diagnosis: Cervicalgia  Acute pain of left shoulder  Abnormal posture     Problem List There are no problems to display for this patient.   Chyrel Masson, PT, DPT, OCS, ATC 03/01/21  4:21 PM     [facility] Physical Therapy [address] [place], [place], [address] Phone: [phone]   Fax:  [phone]  Name: Richard Leonard MRN: [id number] Date of Birth: 1971-09-02

## 2021-03-04 ENCOUNTER — Encounter: Payer: Self-pay | Admitting: Rehabilitative and Restorative Service Providers"

## 2021-03-04 ENCOUNTER — Ambulatory Visit (INDEPENDENT_AMBULATORY_CARE_PROVIDER_SITE_OTHER): Payer: 59 | Admitting: Rehabilitative and Restorative Service Providers"

## 2021-03-04 ENCOUNTER — Other Ambulatory Visit: Payer: Self-pay

## 2021-03-04 DIAGNOSIS — M25512 Pain in left shoulder: Secondary | ICD-10-CM

## 2021-03-04 DIAGNOSIS — M542 Cervicalgia: Secondary | ICD-10-CM | POA: Diagnosis not present

## 2021-03-04 DIAGNOSIS — R293 Abnormal posture: Secondary | ICD-10-CM | POA: Diagnosis not present

## 2021-03-04 NOTE — Therapy (Addendum)
[facility] Physical Therapy [address] [place], [place], [address] Phone: [phone]   Fax:  [phone]  Physical Therapy Treatment/Discharge  Patient Details  Name: Richard Leonard MRN: [id number] Date of Birth: 1971-10-20 Referring Provider (PT): Gregor Hams, MD   Encounter Date: 03/04/2021   PHYSICAL THERAPY DISCHARGE SUMMARY  Visits from Start of Care: 7  Current functional level related to goals / functional outcomes: See note   Remaining deficits: See note   Education / Equipment: HEP Plan: Patient agrees to discharge.  Patient goals were partially met. Patient is being discharged due to the patient's request.  ?????        PT End of Session - 03/04/21 1544    Visit Number 7    Number of Visits 12    Date for PT Re-Evaluation 03/18/21    Authorization Type [employer] 20% coinsurance    Progress Note Due on Visit 10    PT Start Time 1553    PT Stop Time 1620    PT Time Calculation (min) 27 min    Activity Tolerance Patient tolerated treatment well   tightness post activity   Behavior During Therapy [facility] for tasks assessed/performed           History reviewed. No pertinent past medical history.  History reviewed. No pertinent surgical history.  There were no vitals filed for this visit.   Subjective Assessment - 03/04/21 1556    Subjective Pt. stated doing fine until about 1 hour ago and felt neck muscles tighten up, maybe with using touch pad active.  GROC +1 at this time.    Pertinent History prior scoliosis surgery    Limitations Lifting;House hold activities    Diagnostic tests xrays: OA    Patient Stated Goals improve pain    Pain Score 5    at worst   Pain Location Shoulder    Pain Orientation Left    Pain Radiating Towards neck    Pain Onset More than a month ago    Pain Frequency Intermittent    Aggravating Factors  repetitive use                                     PT  Education - 03/04/21 1614    Education Details HEP for D/C    Person(s) Educated Patient    Methods Explanation;Demonstration;Verbal cues;Handout    Comprehension Returned demonstration;Verbalized understanding            PT Short Term Goals - 03/01/21 1616      PT SHORT TERM GOAL #1   Title independent with initial HEP    Status Achieved    Target Date 02/25/21             PT Long Term Goals - 03/04/21 1625      PT LONG TERM GOAL #1   Title independent with final HEP    Status Achieved      PT LONG TERM GOAL #2   Title FOTO score improved to 58 for improved function    Status Achieved      PT LONG TERM GOAL #3   Title report pain < 4/10 with full work day for improved function and mobility    Status Partially Met      PT LONG TERM GOAL #4   Title improve cervical rotation to at least 50 deg bil with pain < 3/10 for  improved ability to drive    Status Partially Met                 Plan - 03/04/21 1555    Clinical Impression Statement Pt. has attended 7 visits overall since start of treatment cycle.  Pain at 5/10 at worst, less frequent at times.  Reported GROC +1 at this time. See objective data for updated information. Mild to moderate gains noted in objective data for cervical and UE at this time.  Continued instances reported of symptoms but periods of improvement reported as well.  Pt. has stabilized in overall presentation at this point and with good knowledge of HEP for continued mobility and strengthening gains, he was appropriate for a d/c to HEP with return prn in future.    Personal Factors and Comorbidities Comorbidity 2;Time since onset of injury/illness/exacerbation    Comorbidities obesity, Herrington Rods    Examination-Activity Limitations Reach Overhead;Bed Mobility;Sleep;Caring for Others;Carry;Lift    Examination-Participation Restrictions Volunteer;Yard Work;Community Activity;Occupation;Driving    Stability/Clinical Decision Making  Evolving/Moderate complexity    Rehab Potential Good    PT Frequency --    PT Duration --    PT Treatment/Interventions ADLs/Self Care Home Management;Cryotherapy;Electrical Stimulation;Moist Heat;Therapeutic activities;Therapeutic exercise;Patient/family education;Neuromuscular re-education;Manual techniques;Passive range of motion;Dry needling;Taping    PT Next Visit Plan Return to MD for follow up.  D/C to HEP c return prn in future.    PT Home Exercise Plan Access Code: [email or web address]    Consulted and Agree with Plan of Care Patient           Patient will benefit from skilled therapeutic intervention in order to improve the following deficits and impairments:  Pain,Postural dysfunction,Impaired tone,Increased muscle spasms,Increased fascial restricitons,Decreased range of motion,Decreased strength,Impaired UE functional use  Visit Diagnosis: Cervicalgia  Acute pain of left shoulder  Abnormal posture     Problem List There are no problems to display for this patient.  Scot Jun, PT, DPT, OCS, ATC 03/05/21  9:26 AM   Treatment time error fixed.  Scot Jun, PT, DPT, OCS, ATC 03/05/21  9:26 AM       [facility] Physical Therapy [address] [place], [place], [address] Phone: [phone]   Fax:  [phone]  Name: Richard Leonard MRN: [id number] Date of Birth: 1971-08-03

## 2021-03-08 ENCOUNTER — Ambulatory Visit: Payer: 59 | Admitting: Family Medicine

## 2021-03-08 NOTE — Progress Notes (Signed)
I, [name], LAT, ATC, am serving as scribe for Dr. [name].  Richard Leonard is a 50 y.o. male who presents to [facility] at [facility] today for f/u of cervical paraspinal and trapezius. Pt was last seen by Dr. [name] on 01/25/21 and was referred to PT of which he's completed 7 visits. Today, pt reports that he has no change in his symptoms despite PT.  He con't to have pain that travels from his L scap into his neck and into his L shoulder and upper arm.  He describes his pain as an aching pain typically w/ some intermittent sharp pains.  He does have some intermittent tingling in his L upper arm.  He had prior c-spine/T-spine sx for scoliosis on 11/16/1985.  Dx imaging: 01/25/21 L shoulder & c-spine XR  Pertinent review of systems: No fevers or chills  Relevant historical information: Prior thoracic scoliosis surgery   Exam:  BP 120/74 (BP Location: Right Arm, Patient Position: Sitting, Cuff Size: Large)   Pulse 71   Ht 5\' 6"  (1.676 m)   Wt 243 lb 3.2 oz (110.3 kg)   SpO2 97%   BMI 39.25 kg/m  General: Well Developed, well nourished, and in no acute distress.   MSK: C-spine normal-appearing nontender midline.  Significantly decreased cervical motion. Tender palpation left trapezius. Upper extremity strength is intact.    Lab and Radiology Results EXAM: CERVICAL SPINE - 2-3 VIEW  COMPARISON:  None.  FINDINGS: Frontal, lateral, and open-mouth odontoid images were obtained. There is mild anterior wedging of the C7 vertebral body of uncertain age. No other evident fracture. No spondylolisthesis. Prevertebral soft tissues and predental space regions are normal. There is moderately severe disc space narrowing at all levels with disc space narrowing most severe at C6-7 and C7-T1. There are anterior osteophytes at C3, C4, C5, and C6. No erosive change. Postoperative fixation noted in the visualized thoracic regions. There is reversal of  lordotic curvature. Lung apices are clear.  IMPRESSION: Age uncertain mild anterior wedging of the C7 vertebral body. No other evident fracture. Multilevel arthropathy. Postoperative change in visualized thoracic spine. There is reversal of lordotic curvature, a finding that may be indicative of a degree of chronic muscle spasm.   Electronically Signed   By: III M.D.   On: 01/26/2021 14:35  I, 01/28/2021, personally (independently) visualized and performed the interpretation of the images attached in this note.    Assessment and Plan: 50 y.o. male with persistent left lateral neck pain.  Patient is failing typical conservative management with physical therapy.  At this point we will proceed to MRI for potential facet injection or epidural steroid injection planning.  Additionally MRI will help characterize cause of pain.  Recheck following MRI.   PDMP not reviewed this encounter. Orders Placed This Encounter  Procedures  . MR CERVICAL SPINE WO CONTRAST    Eval left trap and lateral cspine pain. Facet injection planning.  Hx fusion tspine due to scoliosis.    Standing Status:   Future    Standing Expiration Date:   03/09/2022    Order Specific Question:   What is the patient's sedation requirement?    Answer:   No Sedation    Order Specific Question:   Does the patient have a pacemaker or implanted devices?    Answer:   No    Order Specific Question:   Preferred imaging location?    Answer:   05/09/2022 (table limit-350lbs)  No orders of the defined types were placed in this encounter.    Discussed warning signs or symptoms. Please see discharge instructions. Patient expresses understanding.   The above documentation has been reviewed and is accurate and complete Lynne Leader, M.D.

## 2021-03-09 ENCOUNTER — Encounter: Payer: Self-pay | Admitting: Family Medicine

## 2021-03-09 ENCOUNTER — Ambulatory Visit (INDEPENDENT_AMBULATORY_CARE_PROVIDER_SITE_OTHER): Payer: 59 | Admitting: Family Medicine

## 2021-03-09 ENCOUNTER — Other Ambulatory Visit: Payer: Self-pay

## 2021-03-09 VITALS — BP 120/74 | HR 71 | Ht 66.0 in | Wt 243.2 lb

## 2021-03-09 DIAGNOSIS — M542 Cervicalgia: Secondary | ICD-10-CM | POA: Diagnosis not present

## 2021-03-09 NOTE — Patient Instructions (Signed)
Thank you for coming in today.  You should hear from MRI scheduling within 1 week. If you do not hear please let me know.   Recheck following the MRI   Let me know if you have a problem.

## 2021-03-15 ENCOUNTER — Ambulatory Visit (INDEPENDENT_AMBULATORY_CARE_PROVIDER_SITE_OTHER): Payer: 59

## 2021-03-15 ENCOUNTER — Other Ambulatory Visit: Payer: Self-pay

## 2021-03-15 DIAGNOSIS — M47812 Spondylosis without myelopathy or radiculopathy, cervical region: Secondary | ICD-10-CM

## 2021-03-15 DIAGNOSIS — M4802 Spinal stenosis, cervical region: Secondary | ICD-10-CM | POA: Diagnosis not present

## 2021-03-15 DIAGNOSIS — M542 Cervicalgia: Secondary | ICD-10-CM

## 2021-03-17 NOTE — Progress Notes (Signed)
Multiple areas of arthritis are visible in the cervical spine.  Areas of potential pinched nerves are present.  There are some facet arthritis that could be potentially treated with injection.  Recommend return to clinic to review MRI findings and discuss treatment options.

## 2021-03-23 ENCOUNTER — Ambulatory Visit (INDEPENDENT_AMBULATORY_CARE_PROVIDER_SITE_OTHER): Payer: 59 | Admitting: Family Medicine

## 2021-03-23 ENCOUNTER — Other Ambulatory Visit: Payer: Self-pay

## 2021-03-23 ENCOUNTER — Encounter: Payer: Self-pay | Admitting: Family Medicine

## 2021-03-23 VITALS — BP 122/82 | HR 64 | Ht 66.0 in | Wt 239.8 lb

## 2021-03-23 DIAGNOSIS — M25512 Pain in left shoulder: Secondary | ICD-10-CM

## 2021-03-23 DIAGNOSIS — G8929 Other chronic pain: Secondary | ICD-10-CM

## 2021-03-23 DIAGNOSIS — M542 Cervicalgia: Secondary | ICD-10-CM

## 2021-03-23 NOTE — Patient Instructions (Signed)
Thank you for coming in today.  Try modifying the pushup to a bench press type.   Please call [facility] at [phone] to schedule your spine injection.   Keep me updated.

## 2021-03-23 NOTE — Progress Notes (Signed)
I, Richard Leonard, LAT, ATC, am serving as scribe for Dr. [name].  Richard Leonard is a 50 y.o. male who presents to [facility] today for f/u L lateral neck pain and MRI review. Pt was last seen by Dr. Denyse Leonard on 03/09/21 and was advised to proceed to MRI. He has completed 7 PT sessions.  Today, pt reports no change in his symptoms.  He notes that his pain varies from day-to-day and reports most of the pain is in his L post-lat shoulder to shoulder blade today.  He notes the pain is worse when he does a push-up.  Dx imaging: 03/15/21 C-spine MRI  01/25/21 C-spine & L shoulder XR  Pertinent review of systems: No fevers or chills  Relevant historical information: History scoliosis with fusion thoracic spine   Exam:  BP 122/82 (BP Location: Right Arm, Patient Position: Sitting, Cuff Size: Large)   Pulse 64   Ht 5\' 6"  (1.676 m)   Wt 239 lb 12.8 oz (108.8 kg)   SpO2 97%   BMI 38.70 kg/m  General: Well Developed, well nourished, and in no acute distress.   MSK: Decreased cervical motion. Tender palpation left trapezius. Nontender left scapula. Normal shoulder motion.    Lab and Radiology Results EXAM: MRI CERVICAL SPINE WITHOUT CONTRAST  TECHNIQUE: Multiplanar, multisequence MR imaging of the cervical spine was performed. No intravenous contrast was administered.  COMPARISON:  X-ray 01/25/2021  FINDINGS: Alignment: Reversal of the cervical lordosis.  No static listhesis.  Vertebrae: Multilevel intervertebral disc height loss. Anterior endplate spurring most pronounced at C4-5. Multilevel discogenic endplate marrow changes. No acute fracture. No evidence of discitis. No suspicious bone lesion.  Cord: Normal signal and morphology.  Posterior Fossa, vertebral arteries, paraspinal tissues: Susceptibility artifact from thoracic fusion hardware degrades evaluation of the cervicothoracic junction and adjacent tissues. Visualized  posterior fossa within normal limits. Vertebral artery flow voids intact.  Disc levels:  C2-C3: Posterior disc osteophyte complex with mild bilateral facet arthropathy and left-sided uncovertebral spurring. Findings result in moderate left foraminal stenosis. No canal stenosis.  C3-C4: Posterior disc osteophyte complex with bilateral uncovertebral spurring. Findings result in mild canal stenosis with mild-to-moderate bilateral foraminal stenosis.  C4-C5: Small posterior disc osteophyte complex and right-sided uncovertebral arthropathy resulting in mild canal stenosis and mild right foraminal stenosis.  C5-C6: Posterior disc osteophyte complex, slightly eccentric to the right resulting in mild canal stenosis without foraminal stenosis.  C6-C7: Partially fused disc space. Mild endplate ridging. No foraminal or canal stenosis.  C7-T1: No foraminal or canal stenosis.  IMPRESSION: 1. Multilevel cervical spondylosis with mild canal stenosis from C3-4 through C5-6. 2. Moderate left foraminal stenosis at C2-3. Mild-to-moderate bilateral foraminal stenosis at C3-4.   Electronically Signed   By: 01/27/2021 D.O.   On: 03/16/2021 16:06  I, 05/16/2021, personally (independently) visualized and performed the interpretation of the images attached in this note. Artifact visible around C7 from the retained surgical hardware    Assessment and Plan: 50 y.o. male with persistent left sided neck pain.  Patient had failed conservative management with trial of physical therapy and proceeded to MRI for potential facet injection planning.  He does have some facet DJD and some potential neuroforaminal stenosis but no clear smoking gun because of his neck pain on MRI.  I think we do not have a good look at the facet joints and at C6-C7 and C7-T1 where the majority of his pain is likely to be generated because  of the artifact from the hardware.  He notes that the majority of his pain  occurs when he is doing a push-up.  He thinks push-ups are very important for him and we discussed some potential strategies.  I believe the majority of his push-up related pain is because of his inability to fully extend his neck and that producing pain.  We discussed some strategies including turning over onto his back and turning a push-up into more of a benchpress type position while supporting his neck.  This may be a simple pragmatic solution that allows him to reduce his pain exercise while avoiding further interventions.  However next step would be an epidural steroid injection.  If this should fail neck step after that would be referral to pain management for potential facet injection planning.  He ultimately may need a CT myelogram to further visualize posterior structures degraded by artifact from hardware not an MRI.   PDMP not reviewed this encounter. Orders Placed This Encounter  Procedures  . DG INJECT DIAG/THERA/INC NEEDLE/CATH/PLC EPI/LUMB/SAC W/IMG    Standing Status:   Future    Standing Expiration Date:   03/23/2022    Order Specific Question:   Reason for Exam (SYMPTOM  OR DIAGNOSIS REQUIRED)    Answer:   cervical epidural left trap pain. Level and techniue per radiology    Order Specific Question:   Preferred Imaging Location?    Answer:   [id number]    Order Specific Question:   Radiology Contrast Protocol - do NOT remove file path    Answer:   [email or web address]   No orders of the defined types were placed in this encounter.    Discussed warning signs or symptoms. Please see discharge instructions. Patient expresses understanding.   The above documentation has been reviewed and is accurate and complete [name], M.D.   Total encounter time 20 minutes including face-to-face time with the patient and, reviewing past medical record, and charting on the date of service.   MRI findings treatment plan and options

## 2021-04-26 ENCOUNTER — Other Ambulatory Visit: Payer: 59

## 2021-12-20 IMAGING — CT CT CARDIAC CORONARY ARTERY CALCIUM SCORE
3 series · 14 of 20 positions shown, 16 images · non-contrast
Comparison: None.

CLINICAL DATA: Family history of heart disease.

EXAM:
CT CARDIAC CORONARY ARTERY CALCIUM SCORE
TECHNIQUE: Non-contrast imaging through the heart was performed using
prospective ECG gating. Image post processing was performed on an
independent workstation, allowing for quantitative analysis of the
heart and coronary arteries. Note that this exam targets the heart
and the chest was not imaged in its entirety.

[Series 2: calcium scoring 2.00 qr36 bestdiast 69% hrt calciu · axial · 0.45mm/px · z∈[+1614,+1686]mm · 4 of 60 slices shown]
[im 12/60  vessel]
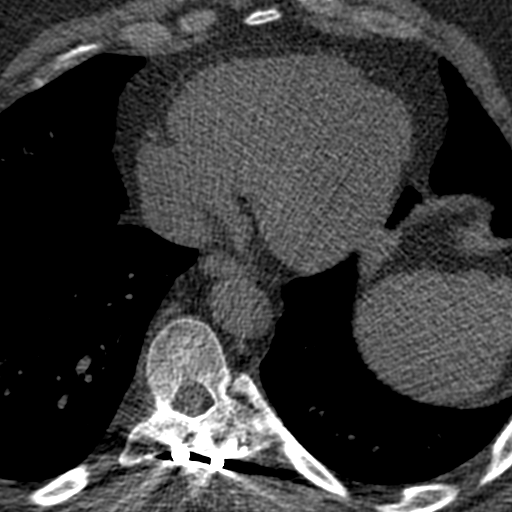
[im 24/60  vessel]
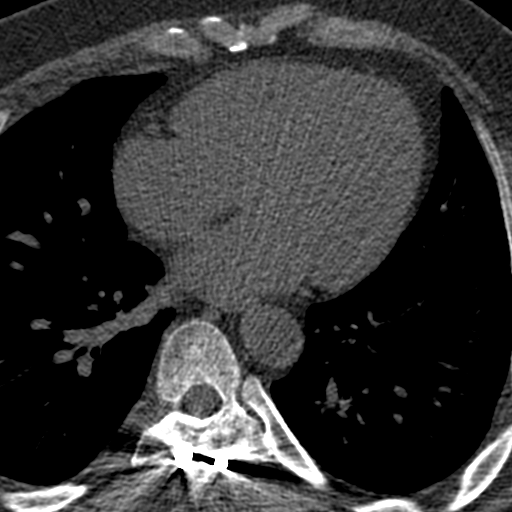
[im 36/60  vessel]
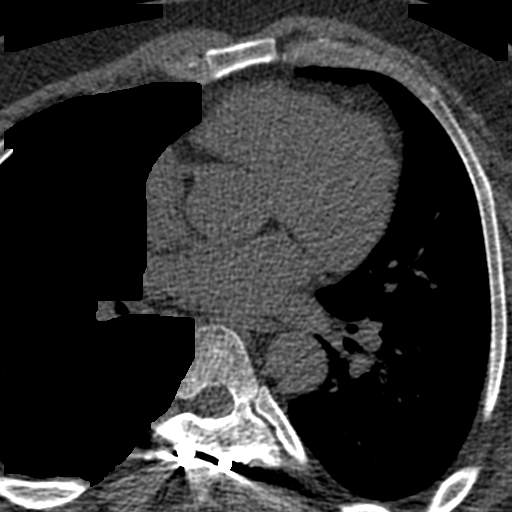
[im 48/60  vessel]
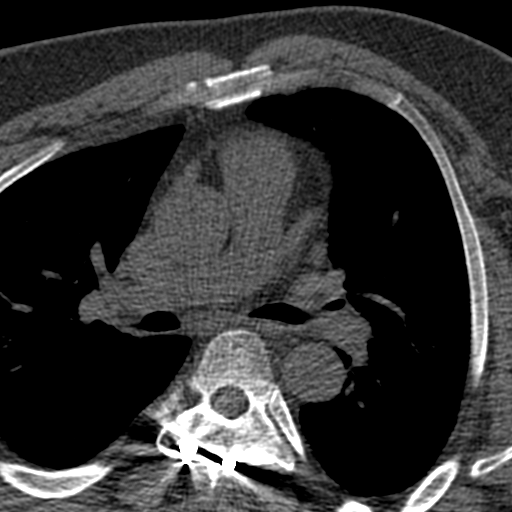

[Series 3: calcium scoring 2.00 br40 bestdiast 69% axial · axial · 0.64mm/px · z∈[+1610,+1690]mm · 5 of 60 slices shown, 7 images]
[im 10/60  vessel]
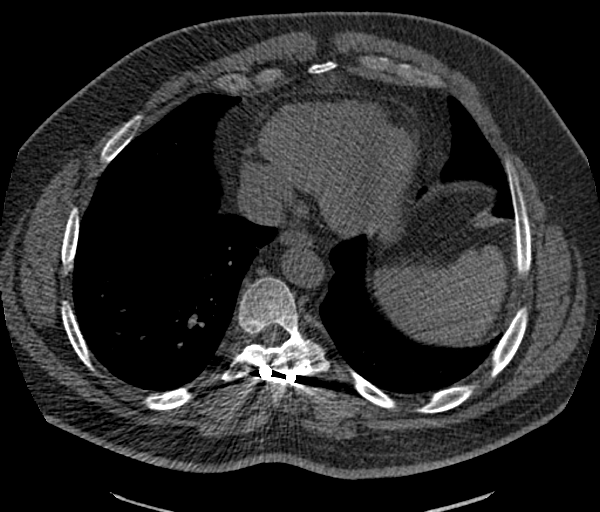
[im 10/60  lung]
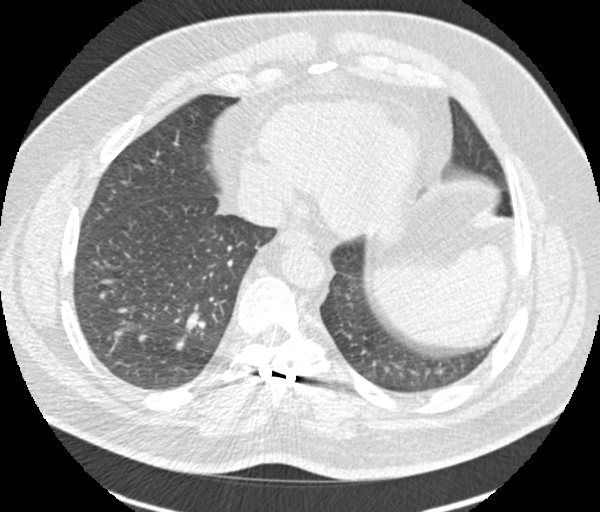
[im 20/60  vessel]
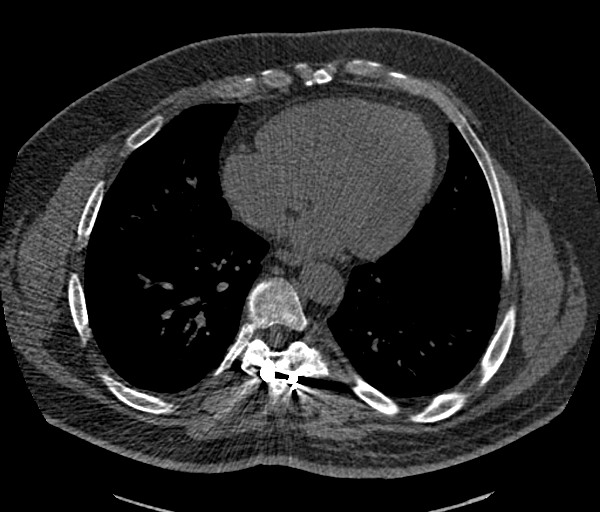
[im 30/60  vessel]
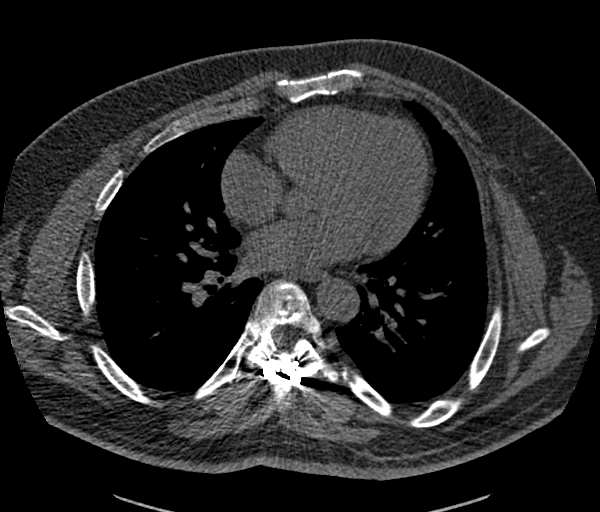
[im 40/60  vessel]
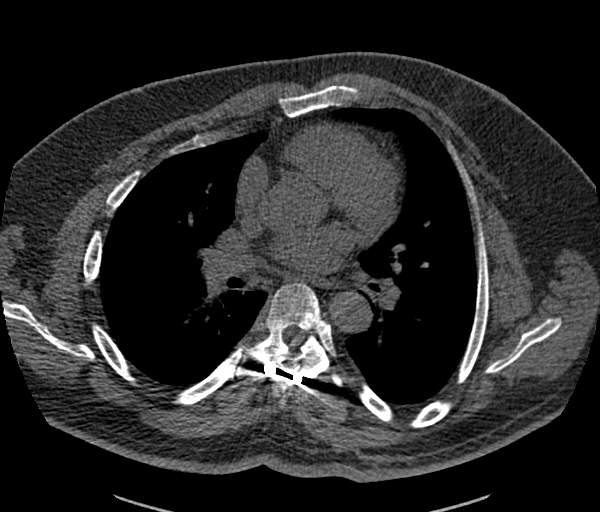
[im 50/60  vessel]
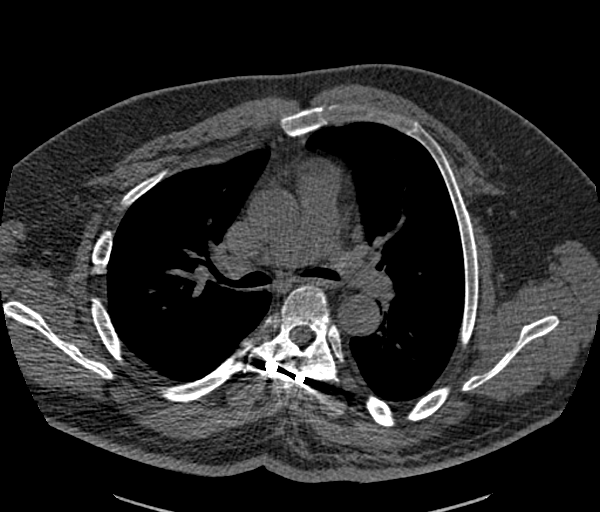
[im 50/60  lung]
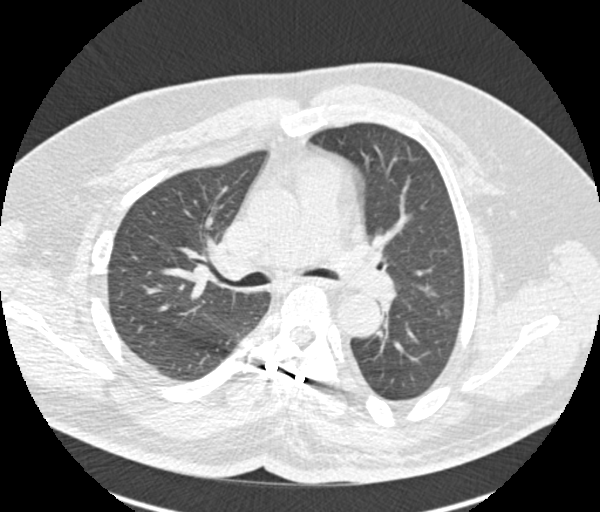

[Series 9: calcium scoring 2.00 br60 bestdiast 69% lungs · axial · 0.66mm/px · z∈[+1610,+1690]mm · 5 of 60 slices shown]
[im 10/60  vessel]
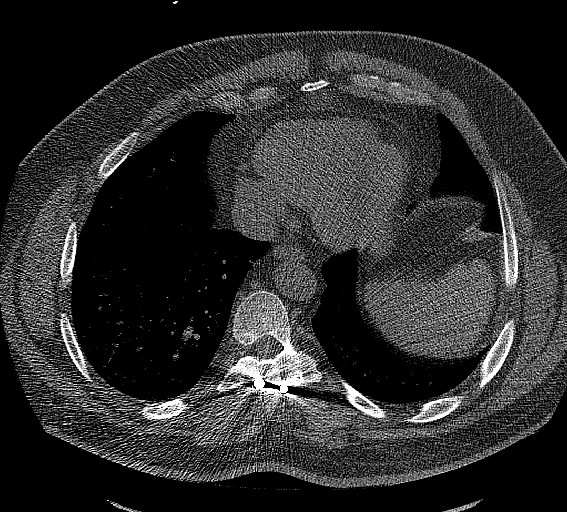
[im 20/60  vessel]
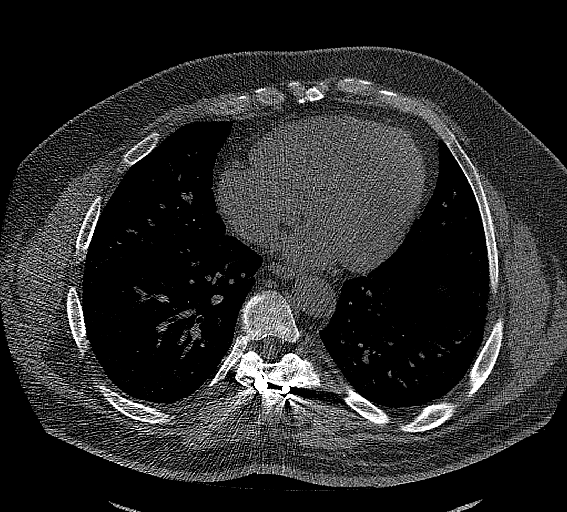
[im 30/60  vessel]
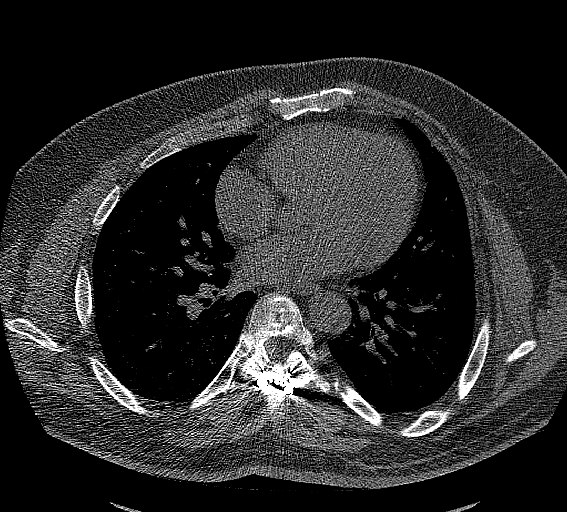
[im 40/60  vessel]
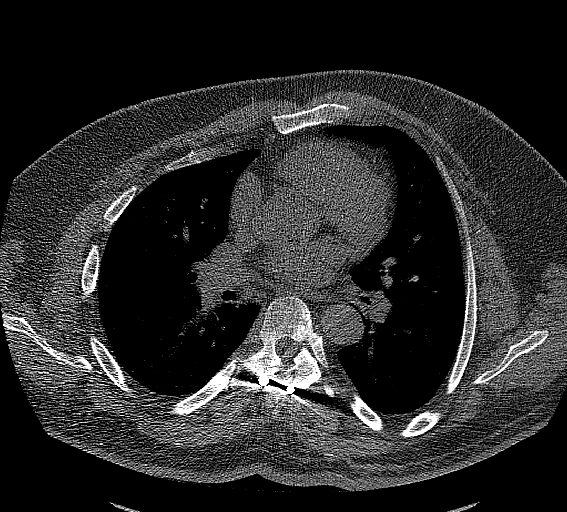
[im 50/60  vessel]
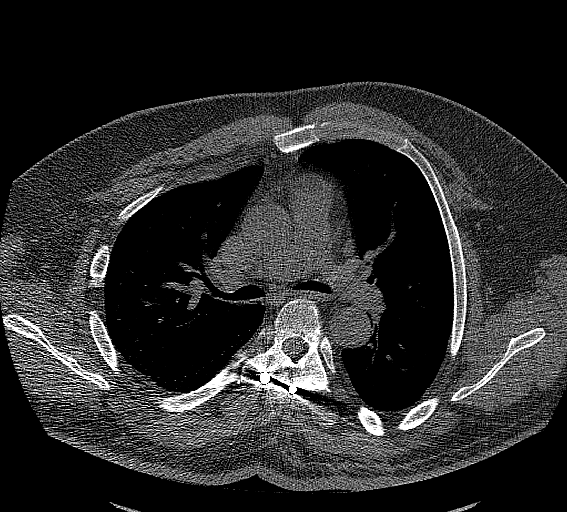

[14 of 20 positions shown; findings below may reference images not displayed]

FINDINGS: Mild limitations secondary to patient body habitus and beam
hardening artifact from thoracic spine fixation.

CORONARY CALCIUM SCORES:

Left Main: 0

LAD: 59.7-calcification in the proximal and mid LAD, including on
[DATE].

LCx: 0

RCA: 0

Total Agatston Score:

[HOSPITAL] percentile: 82nd

AORTA MEASUREMENTS:

Ascending Aorta: 30 mm

Descending Aorta: 25 mm

OTHER FINDINGS:

Cardiovascular: Normal aortic caliber. Tortuous thoracic aorta. Mild
cardiomegaly, without pericardial effusion.

Mediastinum/Nodes: No imaged thoracic adenopathy.

Lungs/Pleura: No pleural fluid.  Clear imaged lungs.

Upper Abdomen: Normal imaged portions of the liver, spleen, stomach.

Musculoskeletal: Thoracic spine fixation with residual spinal
curvature.
IMPRESSION: 1. Total Agatston score of 59.7, corresponding to 82nd percentile
for age, sex, and race based cohort.
2. No acute process in the imaged chest.

## 2022-05-18 ENCOUNTER — Encounter: Payer: Self-pay | Admitting: Pulmonary Disease

## 2022-05-18 ENCOUNTER — Ambulatory Visit (INDEPENDENT_AMBULATORY_CARE_PROVIDER_SITE_OTHER): Payer: BLUE CROSS/BLUE SHIELD | Admitting: Pulmonary Disease

## 2022-05-18 VITALS — BP 120/72 | HR 66 | Temp 97.6°F | Ht 66.0 in | Wt 256.0 lb

## 2022-05-18 DIAGNOSIS — G4733 Obstructive sleep apnea (adult) (pediatric): Secondary | ICD-10-CM | POA: Diagnosis not present

## 2022-05-18 NOTE — Progress Notes (Signed)
Richard Leonard    [id number]    [birth year]  Primary Care Physician:Pharr, Zollie Beckers, MD  Referring Physician: Merri Brunette, MD [address] [place],  [place] [address]  Chief complaint:   Patient was diagnosed with obstructive sleep apnea about a year ago and has been using CPAP Machine is dysfunctional  HPI:  Has not tried to use the CPAP in about 8 months He has had the machine for about a year, only used machine for about a year He was diagnosed with moderate obstructive sleep apnea  His most recent study is still not available to be reviewed We have made contact with the primary doctor's office to try and get a copy of the results and have been unsuccessful so far  Patient is unaware of his current DME company I did encourage him to look on the machine that usually they will have a contact number-encouraged to make contact with his DME company  Usually goes to bed between 11 PM on 1 AM Takes about 30 minutes to fall asleep 2-3 awakenings Wakes up finally about 7:53 AM  Weight has been stable  Has a lot of allergies for which he is currently being evaluated Has a lot of nasal stuffiness We will be hopefully starting allergy shots soon  Denies morning headaches Occasional night sweats if room is warm Denies any significant dryness of his mouth  He does suffer from chronic back pain Back fusion limits activities   Reformed smoker  Outpatient Encounter Medications as of 05/18/2022  Medication Sig   Multiple Vitamin (MULTIVITAMIN WITH MINERALS) TABS tablet Take 1 tablet by mouth daily.   Omega-3 Fatty Acids (FISH OIL) 1000 MG CAPS Take by mouth.   rosuvastatin (CRESTOR) 20 MG tablet Take 20 mg by mouth daily.   vitamin C (ASCORBIC ACID) 250 MG tablet Take 250 mg by mouth daily.   Zinc 100 MG TABS Take by mouth.   [DISCONTINUED] Semaglutide (OZEMPIC, 1 MG/DOSE, [place]) Inject 1 mg into the skin once a week. (Patient not taking: Reported on  05/18/2022)   No facility-administered encounter medications on file as of 05/18/2022.    Allergies as of 05/18/2022   (No Known Allergies)    No past medical history on file.  No past surgical history on file.  No family history on file.  Social History   Socioeconomic History   Marital status: Single    Spouse name: Not on file   Number of children: Not on file   Years of education: Not on file   Highest education level: Not on file  Occupational History   Not on file  Tobacco Use   Smoking status: Former    Packs/day: 1.00    Types: Cigarettes    Quit date: 2020    Years since quitting: 3.4   Smokeless tobacco: Never  Substance and Sexual Activity   Alcohol use: Not on file   Drug use: Not on file   Sexual activity: Not on file  Other Topics Concern   Not on file  Social History Narrative   Not on file   Social Determinants of Health   Financial Resource Strain: Not on file  Food Insecurity: Not on file  Transportation Needs: Not on file  Physical Activity: Not on file  Stress: Not on file  Social Connections: Not on file  Intimate Partner Violence: Not on file    Review of Systems  Constitutional:  Positive for fatigue.  Respiratory:  Positive for apnea.   Psychiatric/Behavioral:  Positive for sleep disturbance.     Vitals:   05/18/22 1553  BP: 120/72  Pulse: 66  Temp: 97.6 F (36.4 C)  SpO2: 96%     Physical Exam Constitutional:      Appearance: He is obese.  HENT:     Head: Normocephalic and atraumatic.     Nose: No congestion.     Mouth/Throat:     Mouth: Mucous membranes are moist.  Eyes:     Pupils: Pupils are equal, round, and reactive to light.  Cardiovascular:     Rate and Rhythm: Normal rate and regular rhythm.     Heart sounds: No murmur heard.    No friction rub.  Pulmonary:     Effort: No respiratory distress.     Breath sounds: No stridor. No wheezing or rhonchi.  Musculoskeletal:     Cervical back: No rigidity or  tenderness.  Neurological:     Mental Status: He is alert.  Psychiatric:        Mood and Affect: Mood normal.  Epworth sleepiness scale of 8  Data Reviewed: Previous study not available none  Assessment:  Excessive daytime sleepiness -Likely related to untreated obstructive sleep apnea  Moderate obstructive sleep apnea -Machine is dysfunctional  Obesity  Pathophysiology of sleep disordered breathing discussed Treatment options discussed  Plan/Recommendations: Did make another Attempt today to obtain his previous sleep study  Patient was encouraged to pay attention to the number on his machine and try and make contact with the medical supply company to see if his machine is serviceable  He may require a new study and a new machine if current machine is not serviceable  We did talk about options of treatment including surgical interventions, inspire device  Follow-up in 3 months  Virl Diamond MD [facility] 05/18/2022, 4:06 PM  CC: Merri Brunette, MD

## 2022-05-18 NOTE — Patient Instructions (Signed)
We will try and make another contact with your primary doctor's office to get some information about your previous sleep study  If we are not able to locate the previous sleep study, we will need a repeat study to reconfirm diagnosis to initiate obtaining a new CPAP device  There may be a number/contact number located on your machine to contact the medical supply company to take a look at your machine to see if it is serviceable  We can make a referral to a different DME company for CPAP if we have the previous study that confirms sleep apnea  Continue weight loss efforts  Graded exercise as tolerated  I will see you back in 3 months

## 2022-05-20 ENCOUNTER — Telehealth: Payer: Self-pay | Admitting: Pulmonary Disease

## 2022-05-20 NOTE — Telephone Encounter (Signed)
Did review patient's sleep study  Diagnosed with mild obstructive sleep apnea with AHI of 13.2  Recommendation was for auto CPAP, ENT evaluation for possible surgical intervention, mandibular advancement devices  Auto CPAP recommendation was auto CPAP of 4-20 with patient's mask of choice

## 2022-05-20 NOTE — Telephone Encounter (Signed)
DME referral  Recommend CPAP therapy for mild obstructive sleep apnea  Auto titrating CPAP with pressure settings of 5-20 will be appropriate  Encourage weight loss measures  Follow-up in the office 4 to 6 weeks following initiation of treatment  Copy of recent sleep study will be scanned to chart

## 2022-08-22 ENCOUNTER — Ambulatory Visit: Payer: BLUE CROSS/BLUE SHIELD | Admitting: Pulmonary Disease

## 2022-09-16 ENCOUNTER — Encounter: Payer: Self-pay | Admitting: Pulmonary Disease

## 2022-09-16 ENCOUNTER — Ambulatory Visit: Payer: BC Managed Care – PPO | Admitting: Pulmonary Disease

## 2022-09-16 VITALS — BP 140/90 | HR 71 | Ht 66.0 in | Wt 258.8 lb

## 2022-09-16 DIAGNOSIS — G4733 Obstructive sleep apnea (adult) (pediatric): Secondary | ICD-10-CM | POA: Diagnosis not present

## 2022-09-16 NOTE — Patient Instructions (Signed)
I will see you in about 2 months  Try to use your CPAP on a nightly basis  If you notice any specific issues that is impacting your tolerance of CPAP, try and let us know  It is important to treat your sleep apnea as we checked during the visit today, your oxygen did drop quite significantly during the study  It is important to treat your sleep apnea to avoid long-term risks  Call with significant concerns

## 2022-09-16 NOTE — Progress Notes (Signed)
Richard Leonard    [id number]    Dec 28, 1970  Primary Care Physician:Pharr, Zollie Beckers, MD  Referring Physician: Merri Brunette, MD [address] [place],  [place] [address]  Chief complaint:   Patient was diagnosed with obstructive sleep apnea about a year ago and has been using CPAP  HPI:  Has not been using CPAP recently, just not tolerated well I did use that well in the past  He was diagnosed with moderate sleep apnea in the past  Most recent sleep study on record on 02/06/2020 does show mild obstructive sleep apnea with an AHI of 13.6, oxygen saturations as low as 66%  The importance of using CPAP was reiterated today  Risks of not treating sleep disordered breathing was also discussed   Usually goes to bed between 11 PM on 1 AM Takes about 30 minutes to fall asleep 2-3 awakenings Wakes up finally about 7:30 AM  Weight has been stable  Has a lot of allergies for which he is currently being evaluated Has a lot of nasal stuffiness We will be hopefully starting allergy shots soon  Denies morning headaches Occasional night sweats if room is warm Denies any significant dryness of his mouth  He does suffer from chronic back pain Back fusion limits activities   Reformed smoker  Outpatient Encounter Medications as of 09/16/2022  Medication Sig   Multiple Vitamin (MULTIVITAMIN WITH MINERALS) TABS tablet Take 1 tablet by mouth daily.   Omega-3 Fatty Acids (FISH OIL) 1000 MG CAPS Take by mouth.   rosuvastatin (CRESTOR) 20 MG tablet Take 20 mg by mouth daily.   vitamin C (ASCORBIC ACID) 250 MG tablet Take 250 mg by mouth daily.   Zinc 100 MG TABS Take by mouth.   No facility-administered encounter medications on file as of 09/16/2022.    Allergies as of 09/16/2022   (No Known Allergies)    No past medical history on file.  No past surgical history on file.  No family history on file.  Social History   Socioeconomic History    Marital status: Single    Spouse name: Not on file   Number of children: Not on file   Years of education: Not on file   Highest education level: Not on file  Occupational History   Not on file  Tobacco Use   Smoking status: Former    Packs/day: 1.00    Types: Cigarettes    Quit date: 2020    Years since quitting: 3.7   Smokeless tobacco: Never  Substance and Sexual Activity   Alcohol use: Not on file   Drug use: Not on file   Sexual activity: Not on file  Other Topics Concern   Not on file  Social History Narrative   Not on file   Social Determinants of Health   Financial Resource Strain: Not on file  Food Insecurity: Not on file  Transportation Needs: Not on file  Physical Activity: Not on file  Stress: Not on file  Social Connections: Not on file  Intimate Partner Violence: Not on file    Review of Systems  Constitutional:  Positive for fatigue.  Respiratory:  Positive for apnea.   Psychiatric/Behavioral:  Positive for sleep disturbance.     Vitals:   09/16/22 1528  BP: (!) 140/90  Pulse: 71  SpO2: 96%     Physical Exam Constitutional:      Appearance: He is obese.  HENT:  Head: Normocephalic and atraumatic.     Nose: No congestion.     Mouth/Throat:     Mouth: Mucous membranes are moist.  Eyes:     Pupils: Pupils are equal, round, and reactive to light.  Cardiovascular:     Rate and Rhythm: Normal rate and regular rhythm.     Heart sounds: No murmur heard.    No friction rub.  Pulmonary:     Effort: No respiratory distress.     Breath sounds: No stridor. No wheezing or rhonchi.  Musculoskeletal:     Cervical back: No rigidity or tenderness.  Neurological:     Mental Status: He is alert.  Psychiatric:        Mood and Affect: Mood normal.   Epworth sleepiness scale of 8  Data Reviewed: Previous sleep study was reviewed and discussed with patient  Assessment:  Excessive daytime sleepiness -Likely related to untreated sleep disordered  breathing  Mild obstructive sleep apnea on most recent study  Obesity  Pathophysiology of sleep disordered breathing discussed Treatment options discussed  Plan/Recommendations: Encouraged to reinitiate CPAP therapy  Pay attention to specific limitation status not letting him tolerate CPAP therapy  Weight loss efforts encouraged  Other options of treatment was discussed with the patient  Inspire device was discussed, will require repeat sleep study as last study was over 2 years ago  Follow-up in 2 to 3 months  Encouraged to call with any significant concerns  Sherrilyn Rist MD [facility] 09/16/2022, 3:34 PM  CC: Deland Pretty, MD

## 2022-11-04 ENCOUNTER — Ambulatory Visit: Payer: BC Managed Care – PPO | Admitting: Pulmonary Disease

## 2022-12-16 ENCOUNTER — Encounter: Payer: Self-pay | Admitting: Pulmonary Disease

## 2022-12-16 ENCOUNTER — Ambulatory Visit: Payer: BC Managed Care – PPO | Admitting: Pulmonary Disease

## 2022-12-16 VITALS — BP 124/78 | HR 71 | Ht 66.0 in | Wt 260.4 lb

## 2022-12-16 DIAGNOSIS — G4733 Obstructive sleep apnea (adult) (pediatric): Secondary | ICD-10-CM

## 2022-12-16 NOTE — Progress Notes (Signed)
Richard Leonard    [id number]    December 03, 1971  Primary Care Physician:Pharr, Thayer Jew, MD  Referring Physician: Deland Pretty, MD [address] [place],  [place] [address]  Chief complaint:   Patient was diagnosed with obstructive sleep apnea about a year ago and has been using CPAP  HPI:  Continues having issues with CPAP tolerance Able to tolerate some nights on some nights does have difficulty  Diagnosed with mild obstructive sleep apnea with AHI of 13.6 with oxygen desaturations  Multiple back problems  The importance of using CPAP was reiterated today  Risks of not treating sleep disordered breathing was also discussed  Usually goes to bed between 11 PM on 1 AM Takes about 30 minutes to fall asleep 2-3 awakenings Wakes up finally about 7:30 AM  Weight has been stable  History of multiple allergies  Denies morning headaches Occasional night sweats if room is warm Denies any significant dryness of his mouth  He does suffer from chronic back pain Back fusion limits activities  Reformed smoker  Outpatient Encounter Medications as of 12/16/2022  Medication Sig   Multiple Vitamin (MULTIVITAMIN WITH MINERALS) TABS tablet Take 1 tablet by mouth daily.   Omega-3 Fatty Acids (FISH OIL) 1000 MG CAPS Take by mouth.   rosuvastatin (CRESTOR) 20 MG tablet Take 20 mg by mouth daily.   vitamin C (ASCORBIC ACID) 250 MG tablet Take 250 mg by mouth daily.   Zinc 100 MG TABS Take by mouth.   Ascorbic Acid (VITAMIN C) 500 MG CAPS Vitamin C (Patient not taking: Reported on 12/16/2022)   No facility-administered encounter medications on file as of 12/16/2022.    Allergies as of 12/16/2022   (No Known Allergies)    No past medical history on file.  No past surgical history on file.  No family history on file.  Social History   Socioeconomic History   Marital status: Single    Spouse name: Not on file   Number of children: Not on file   Years of  education: Not on file   Highest education level: Not on file  Occupational History   Not on file  Tobacco Use   Smoking status: Former    Packs/day: 1.00    Types: Cigarettes    Quit date: 2020    Years since quitting: 4.0   Smokeless tobacco: Never  Substance and Sexual Activity   Alcohol use: Not on file   Drug use: Not on file   Sexual activity: Not on file  Other Topics Concern   Not on file  Social History Narrative   Not on file   Social Determinants of Health   Financial Resource Strain: Not on file  Food Insecurity: Not on file  Transportation Needs: Not on file  Physical Activity: Not on file  Stress: Not on file  Social Connections: Not on file  Intimate Partner Violence: Not on file    Review of Systems  Constitutional:  Positive for fatigue.  Respiratory:  Positive for apnea.   Psychiatric/Behavioral:  Positive for sleep disturbance.     Vitals:   12/16/22 1322  BP: 124/78  Pulse: 71  SpO2: 94%     Physical Exam Constitutional:      Appearance: He is obese.  HENT:     Head: Normocephalic and atraumatic.     Nose: No congestion.     Mouth/Throat:     Mouth: Mucous membranes are moist.  Eyes:  General: No scleral icterus. Cardiovascular:     Rate and Rhythm: Normal rate and regular rhythm.     Heart sounds: No murmur heard.    No friction rub.  Pulmonary:     Effort: No respiratory distress.     Breath sounds: No stridor. No wheezing or rhonchi.  Musculoskeletal:     Cervical back: No rigidity or tenderness.  Neurological:     Mental Status: He is alert.  Psychiatric:        Mood and Affect: Mood normal.    Epworth sleepiness scale of 8  Data Reviewed: Previous sleep study was reviewed and discussed with patient -AHI on sleep study of 13.6  Assessment:  Excessive daytime sleepiness -Likely related to suboptimally treated sleep disordered breathing  Mild obstructive sleep apnea on most recent  study  Obesity  Pathophysiology of sleep disordered breathing discussed Treatment options discussed  Plan/Recommendations: Encouraged to continue using CPAP therapy  Will reach out to primary doctor to find out about his DME supplier Patient is not sure who his DME supplier is Does not have a smart card in his machine  Weight loss efforts encouraged  Regular exercises encouraged  Other options for treatment of sleep disordered breathing discussed including elevation of the head of the bed, lateral sleep and may help  Follow-up in 3 to 4 months  Encouraged to call with any significant concerns  Sherrilyn Rist MD [facility] 12/16/2022, 1:26 PM  CC: Deland Pretty, MD

## 2022-12-16 NOTE — Patient Instructions (Signed)
Continue to use CPAP on a nightly basis  We will contact Dr. Pennie Banter office to see if they can give [place] any information about the medical supply company  Weight loss efforts as able  I will see him in 3 to 4 months

## 2024-08-26 ENCOUNTER — Other Ambulatory Visit: Payer: Self-pay

## 2024-08-26 ENCOUNTER — Encounter: Payer: Self-pay | Admitting: Sports Medicine

## 2024-08-26 ENCOUNTER — Other Ambulatory Visit (INDEPENDENT_AMBULATORY_CARE_PROVIDER_SITE_OTHER): Payer: Self-pay

## 2024-08-26 ENCOUNTER — Ambulatory Visit: Admitting: Sports Medicine

## 2024-08-26 DIAGNOSIS — G2589 Other specified extrapyramidal and movement disorders: Secondary | ICD-10-CM

## 2024-08-26 DIAGNOSIS — M19012 Primary osteoarthritis, left shoulder: Secondary | ICD-10-CM | POA: Diagnosis not present

## 2024-08-26 DIAGNOSIS — G8929 Other chronic pain: Secondary | ICD-10-CM

## 2024-08-26 DIAGNOSIS — M25512 Pain in left shoulder: Secondary | ICD-10-CM

## 2024-08-26 DIAGNOSIS — M5030 Other cervical disc degeneration, unspecified cervical region: Secondary | ICD-10-CM | POA: Diagnosis not present

## 2024-08-26 MED ORDER — METHYLPREDNISOLONE ACETATE 40 MG/ML IJ SUSP
80.0000 mg | INTRAMUSCULAR | Status: AC | PRN
  Administered 2024-08-26: 80 mg via INTRA_ARTICULAR

## 2024-08-26 MED ORDER — LIDOCAINE HCL 1 % IJ SOLN
2.0000 mL | INTRAMUSCULAR | Status: AC | PRN
  Administered 2024-08-26: 2 mL

## 2024-08-26 MED ORDER — BUPIVACAINE HCL 0.25 % IJ SOLN
2.0000 mL | INTRAMUSCULAR | Status: AC | PRN
  Administered 2024-08-26: 2 mL via INTRA_ARTICULAR

## 2024-08-26 NOTE — Progress Notes (Signed)
 Richard Leonard - 53 y.o. male MRN [id number]  Date of birth: [birth year]  Office Visit Note: Visit Date: 08/26/2024 PCP: Clarice Nottingham, MD Referred by: Clarice Nottingham, MD  Medical Resident, Sports Medicine Fellow - Attending Physician Addendum:   I have independently interviewed and examined the patient myself. I have discussed the above with the original author and agree with their documentation. My edits for correction/addition/clarification have been made, see any changes above and below.   In summary, pleasant 53 year old man with both neck, scapular as well as shoulder pain.  X-rays and MRI showed notable DDD with cervical OA and loss of lordotic curve.  He does have mild to moderate arthritis about the left shoulder.  For diagnostic and therapeutic purposes, did proceed with ultrasound-guided left shoulder glenohumeral joint injection.  Advised on postinjection protocol.  He does have a history of previous thoracic scoliosis surgery in his teen years, current exam shows reciprocal left sided scapulothoracic dyskinesia with associated hypertonicity.  We will send him to formalized physical therapy to work on scapular retraction and activation as well as improving range of motion of the cervical spine.  His x-rays, short neck and limited range of motion to concern me for possible Klippel-Feil syndrome (will discuss with my spine orthopedic).  He will work through therapy, is okay to take ibuprofen/Aleve or over-the-counter anti-inflammatories only as needed.  Follow-up in 6 weeks.  Lonell Sprang, DO Primary Care Sports Medicine Physician  [facility] - Orthoped[facility]   Subjective: Chief Complaint  Patient presents with   Left Shoulder - Pain   HPI: Richard Leonard is a pleasant 53 y.o. male who presents today for left shoulder pain.  Ongoing for many years. Reports achy/sharp pain that radiates down from his posterior shoulder towards his triceps. This is worse with any  activity that involved him lifting heavier objects or elevating his shoulder. Has noticed it most with mowing the lawn or lifting heavier objects in the garage. He does note that he maybe had an injury while benching over 20 years ago. This is not a new occurrence for him this is previously evaluated 3 years ago which is why he had a cervical MRI performed. He intermittently and occasionally has numbness and tingling in his fingers but this is not a normal occurrence. He has not noticed any weakness in his upper extremities. He had extensive surgery for scoliosis when he was in his teens. Has not tried Tylenol or ibuprofen, he wishes to avoid if possible.  Pertinent ROS were reviewed with the patient and found to be negative unless otherwise specified above in HPI.   Assessment & Plan: Visit Diagnoses:  1. Primary osteoarthritis, left shoulder   2. Chronic left shoulder pain   3. Scapular dyskinesis   4. DDD (degenerative disc disease), cervical   5. Chronic neck pain    Plan: Impression: Clinically most consistent with likely combination of glenohumeral joint arthritis causing posterior lateral shoulder pain with some radiation down the left tricep, this is further consistent with mild to moderate degenerative changes seen on shoulder XR performed today.  However, it is difficult to rule out that there is not an additional component of cervical radiculopathic pain given notable cervical column degenerative changes seen on prior MRI and significant loss of lordosis of the cervical spine.  Additionally given patient's history of scoliosis now s/p surgery he likely has chronic muscular imbalances of the scapular musculature and shoulder girdle.    After discussion with patient, left ultrasound-guided glenohumeral  joint injection performed today as below.  Discussed with patient to let physical therapist know whether he has had significant relief from the joint injection or message Dr. Burnetta on  MyChart, as this would indicate a larger component of his pain is secondary to glenohumeral osteoarthritis versus cervical radiculopathy.  Patient also referred for neck, scapular and shoulder physical therapy as this should additionally benefit and reduce his pain with ultimate goal of improved range of motion and strength.  Additionally counseled regarding as needed over-the-counter Tylenol/Advil for intermittent pain.  Follow-up: Return in about 6 weeks (around 10/07/2024) for L-shoulder/trap/neck .   Meds & Orders: No orders of the defined types were placed in this encounter.   Orders Placed This Encounter  Procedures   Large Joint Inj: L glenohumeral   XR Shoulder Left   US  Guided Needle Placement - No Linked Charges   Ambulatory referral to Physical Therapy     Procedures: Large Joint Inj: L glenohumeral on 08/26/2024 11:05 AM Indications: pain and diagnostic evaluation Details: 22 G 3.5 in needle, ultrasound-guided posterior approach Medications: 2 mL lidocaine  1 %; 2 mL bupivacaine  0.25 %; 80 mg methylPREDNISolone  acetate 40 MG/ML Outcome: tolerated well, no immediate complications  US -guided glenohumeral joint injection, left shoulder After discussion on risks/benefits/indications, informed verbal consent was obtained. A timeout was then performed. The patient was positioned lying lateral recumbent on examination table. The patient's shoulder was prepped with betadine and multiple alcohol swabs and utilizing ultrasound guidance, the patient's glenohumeral joint was identified on ultrasound. Using ultrasound guidance a 22-gauge, 3.5 inch needle with a mixture of 2:2:2 cc's lidocaine :bupivicaine:depomedrol was directed from a lateral to medial direction via in-plane technique into the glenohumeral joint with visualization of appropriate spread of injectate into the joint. Patient tolerated the procedure well without immediate complications.  Procedure, treatment alternatives, risks and  benefits explained, specific risks discussed. Consent was given by the patient. Immediately prior to procedure a time out was called to verify the correct patient, procedure, equipment, support staff and site/side marked as required. Patient was prepped and draped in the usual sterile fashion.           Clinical History: No specialty comments available.  He reports that he quit smoking about 5 years ago. His smoking use included cigarettes. He has never used smokeless tobacco. No results for input(s): HGBA1C, LABURIC in the last 8760 hours.  Objective:    Physical Exam  Gen: Well-appearing, in no acute distress; non-toxic CV: Well-perfused. Warm.  Resp: Breathing unlabored on room air; no wheezing. Psych: Fluid speech in conversation; appropriate affect; normal thought process  Ortho Exam Left shoulder  Inspection: No obvious deformity, erythema, swelling, ecchymoses Palpation: NTTP at medial scapula, scapular ridge, acromion, clavicle, biceps tendon, coracoid ROM: Symmetrically reduced range of motion shoulder abduction bilaterally to 110 degrees, full ROM of shoulder flexion and internal rotation bilaterally, symmetrically decreased ROM of external rotation to 30 degrees Special Tests: Negative empty can, negative crossarm, negative Hawkins, negative Neer's, decreased velocity of left scapular tracking in comparison to right with shoulder flexion and abduction Strength: 5/5 left shoulder abduction, adduction, flexion, internal and external rotation  Neck Inspection: loss of neck lordosis, No obvious, erythema, swelling, ecchymoses  Palpation: NTTP at cervical spinous processes ROM: cannot extend neck past 0 degrees, full flexion, symmetrically reduced lateral flexion and neck rotation bilaterally Special Tests: Unable to perform true spurlings due decrease neck ROM, no reproducible pain with cervical compression with neck at 0 degrees extension and left sided  rotation Strength:  CN XI intact Neurovascular: bilaterally, grip strength, biceps, and triceps 5/5  Imaging: XR Shoulder Left Result Date: 08/26/2024 4 view x-ray of the left shoulder including AP, Grashey, scapular Y and axial view were ordered and reviewed by myself today.  X-rays demonstrate mild to moderate glenohumeral joint narrowing with associated arthritic change and a inferior drop osteophyte off the humeral head.  There is a degree of subchondral sclerosis within the acetabulum.  Mild AC joint arthritic change.  No acute fracture noted.  *Independent review and interpretation of the cervical spine MRI from 03/15/2022 was performed by myself today.  There is reversal of normal cervical lordosis.  There is multilevel advanced spondylosis with notable disc osteophyte complexes with C4-C5 severe degenerative disc disease with partial osseous fusion most notable at C6-C7 into a lesser extent C5-C6.  There is associated mild to moderate foraminal stenosis noted at these locations.  Narrative & Impression  CLINICAL DATA:  Chronic neck pain and left arm pain. History of scoliosis surgery.   EXAM: MRI CERVICAL SPINE WITHOUT CONTRAST   TECHNIQUE: Multiplanar, multisequence MR imaging of the cervical spine was performed. No intravenous contrast was administered.   COMPARISON:  X-ray 01/25/2021   FINDINGS: Alignment: Reversal of the cervical lordosis.  No static listhesis.   Vertebrae: Multilevel intervertebral disc height loss. Anterior endplate spurring most pronounced at C4-5. Multilevel discogenic endplate marrow changes. No acute fracture. No evidence of discitis. No suspicious bone lesion.   Cord: Normal signal and morphology.   Posterior Fossa, vertebral arteries, paraspinal tissues: Susceptibility artifact from thoracic fusion hardware degrades evaluation of the cervicothoracic junction and adjacent tissues. Visualized posterior fossa within normal limits. Vertebral artery flow voids intact.    Disc levels:   C2-C3: Posterior disc osteophyte complex with mild bilateral facet arthropathy and left-sided uncovertebral spurring. Findings result in moderate left foraminal stenosis. No canal stenosis.   C3-C4: Posterior disc osteophyte complex with bilateral uncovertebral spurring. Findings result in mild canal stenosis with mild-to-moderate bilateral foraminal stenosis.   C4-C5: Small posterior disc osteophyte complex and right-sided uncovertebral arthropathy resulting in mild canal stenosis and mild right foraminal stenosis.   C5-C6: Posterior disc osteophyte complex, slightly eccentric to the right resulting in mild canal stenosis without foraminal stenosis.   C6-C7: Partially fused disc space. Mild endplate ridging. No foraminal or canal stenosis.   C7-T1: No foraminal or canal stenosis.   IMPRESSION: 1. Multilevel cervical spondylosis with mild canal stenosis from C3-4 through C5-6. 2. Moderate left foraminal stenosis at C2-3. Mild-to-moderate bilateral foraminal stenosis at C3-4.     Electronically Signed   By: Mabel Converse D.O.   On: 03/16/2021 16:06   Narrative & Impression  CLINICAL DATA:  Cervicalgia with left-sided radicular symptoms   EXAM: CERVICAL SPINE - 2-3 VIEW   COMPARISON:  None.   FINDINGS: Frontal, lateral, and open-mouth odontoid images were obtained. There is mild anterior wedging of the C7 vertebral body of uncertain age. No other evident fracture. No spondylolisthesis. Prevertebral soft tissues and predental space regions are normal. There is moderately severe disc space narrowing at all levels with disc space narrowing most severe at C6-7 and C7-T1. There are anterior osteophytes at C3, C4, C5, and C6. No erosive change. Postoperative fixation noted in the visualized thoracic regions. There is reversal of lordotic curvature. Lung apices are clear.   IMPRESSION: Age uncertain mild anterior wedging of the C7 vertebral body.  No other evident fracture. Multilevel arthropathy. Postoperative change in visualized thoracic spine. There  is reversal of lordotic curvature, a finding that may be indicative of a degree of chronic muscle spasm.     Electronically Signed   By: Elsie Repine III M.D.   On: 01/26/2021 14:35    Past Medical/Family/Surgical/Social History: Medications & Allergies reviewed per EMR, new medications updated. There are no active problems to display for this patient.  History reviewed. No pertinent past medical history. History reviewed. No pertinent family history. History reviewed. No pertinent surgical history. Social History   Occupational History   Not on file  Tobacco Use   Smoking status: Former    Current packs/day: 0.00    Types: Cigarettes    Quit date: 2020    Years since quitting: 5.7   Smokeless tobacco: Never  Substance and Sexual Activity   Alcohol use: Not on file   Drug use: Not on file   Sexual activity: Not on file

## 2024-08-26 NOTE — Progress Notes (Signed)
 Patient says that about 20 years ago, he was bench pressing at the gym and felt a pop in the back of his shoulder. He says that he has pain with anything that uses the shoulder, including any pressing/pressing movements, or any time that he is in shoulder flexion. He is left-hand dominant, and says that he feels both weaker and more painful on the left side, whereas he used to feel stronger on the left side. He has pain under the left scapula, and he does have pain that goes down the triceps when his shoulder is fatigued. He will also feel tingling in the triceps, although this tingling does not pass the elbow. He is not taking medication for this pain. He has done physical therapy in the past, although says it was painful when he did.

## 2024-09-24 ENCOUNTER — Ambulatory Visit: Admitting: Rehabilitative and Restorative Service Providers"

## 2024-10-07 ENCOUNTER — Ambulatory Visit: Admitting: Sports Medicine

## 2024-10-07 ENCOUNTER — Encounter: Payer: Self-pay | Admitting: Radiology

## 2024-10-07 NOTE — Therapy (Incomplete)
 OUTPATIENT PHYSICAL THERAPY EVALUATION   Patient Name: Richard Leonard MRN: [id number] DOB:June 24, 1971, 53 y.o., male Today's Date: 10/07/2024  END OF SESSION:   No past medical history on file. No past surgical history on file. There are no active problems to display for this patient.   PCP: Clarice Nottingham MD  REFERRING PROVIDER: Burnetta Brunet, DO  REFERRING DIAG: [phone] (ICD-10-CM) - Chronic left shoulder pain G25.89 (ICD-10-CM) - Scapular dyskinesis M19.012 (ICD-10-CM) - Arthritis of left glenohumeral joint  THERAPY DIAG:  No diagnosis found.  Rationale for Evaluation and Treatment: Rehabilitation  ONSET DATE: ***  SUBJECTIVE:                                                                                                                                                                                      SUBJECTIVE STATEMENT: ***  PERTINENT HISTORY: Neck pain. Left-sided scapulothoracic hypertonicity with dyskinesia.  PAIN:  NPRS scale: ***/10 Pain location: *** Pain description: *** Aggravating factors: *** Relieving factors: ***  PRECAUTIONS: None  WEIGHT BEARING RESTRICTIONS: No  FALLS:  Has patient fallen in last 6 months? No  LIVING ENVIRONMENT: Lives with: {[facility] lives with:25569::lives with their family} Lives in: {Lives in:25570} Stairs: {opstairs:27293} Has following equipment at home: {Assistive devices:23999}  OCCUPATION: ***  PLOF: Independent  PATIENT GOALS:***  Next MD visit:   OBJECTIVE:   DIAGNOSTIC FINDINGS:  ***  PATIENT SURVEYS:  Patient-Specific Activity Scoring Scheme  0 represents "unable to perform." 10 represents "able to perform at prior level. 0 1 2 3 4 5 6 7 8 9  10 (Date and Score)   Activity Eval  10/08/2024    1. ***      2. ***      3. ***    4.    5.    Score ***    Total score = sum of the activity scores/number of activities Minimum detectable change (90%CI) for average score = 2  points Minimum detectable change (90%CI) for single activity score = 3 points  COGNITION: 10/08/2024 Overall cognitive status: WFL     SENSATION: 10/08/2024 {sensation:27233}  POSTURE: 10/08/2024 ***  UPPER EXTREMITY ROM:   ROM Right Eval 10/08/2024 Left Eval 10/08/2024  Shoulder flexion    Shoulder extension    Shoulder abduction    Shoulder adduction    Shoulder internal rotation    Shoulder external rotation    Elbow flexion    Elbow extension    Wrist flexion    Wrist extension    Wrist ulnar deviation    Wrist radial deviation    Wrist pronation    Wrist supination    (Blank rows = not tested)  UPPER EXTREMITY MMT:  MMT Right Eval 10/08/2024 Left Eval 10/08/2024  Shoulder flexion    Shoulder extension    Shoulder abduction    Shoulder adduction    Shoulder internal rotation    Shoulder external rotation    Middle trapezius    Lower trapezius    Elbow flexion    Elbow extension    Wrist flexion    Wrist extension    Wrist ulnar deviation    Wrist radial deviation    Wrist pronation    Wrist supination    Grip strength (lbs)    (Blank rows = not tested)  SHOULDER SPECIAL TESTS: 10/08/2024   JOINT MOBILITY TESTING:  10/08/2024 ***  PALPATION:  10/08/2024 ***                                                                                                                                                                                                  TODAY'S TREATMENT:                                                                                                       DATE: 10/08/2024 Therex:    HEP instruction/performance c cues for techniques, handout provided.  Trial set performed of each for comprehension and symptom assessment.  See below for exercise list   PATIENT EDUCATION: 10/08/2024 Education details: HEP, POC Person educated: Patient Education method: Explanation, Demonstration, Verbal cues, and Handouts Education comprehension:  verbalized understanding, returned demonstration, and verbal cues required  HOME EXERCISE PROGRAM: ***  ASSESSMENT:  CLINICAL IMPRESSION: Patient is a 53 y.o. who comes to clinic with complaints of Lt shoulder, neck pain with mobility, strength and movement coordination deficits that impair their ability to perform usual daily and recreational functional activities without increase difficulty/symptoms at this time.  Patient to benefit from skilled PT services to address impairments and limitations to improve to previous level of function without restriction secondary to condition.   OBJECTIVE IMPAIRMENTS: {opptimpairments:25111}.   ACTIVITY LIMITATIONS: {activitylimitations:27494}  PARTICIPATION LIMITATIONS: {participationrestrictions:25113}  PERSONAL FACTORS: {Personal factors:25162} are also affecting patient's functional outcome.   REHAB POTENTIAL: {rehabpotential:25112}  CLINICAL DECISION MAKING: {clinical decision making:25114}  EVALUATION COMPLEXITY: {  Evaluation complexity:25115}   GOALS: Goals reviewed with patient? Yes  SHORT TERM GOALS: (target date for Short term goals are 3 weeks 10/29/2024)  1.Patient will demonstrate independent use of home exercise program to maintain progress from in clinic treatments. Goal status: New  LONG TERM GOALS: (target dates for all long term goals are 10 weeks  12/17/2024 )   1. Patient will demonstrate/report pain at worst less than or equal to 2/10 to facilitate minimal limitation in daily activity secondary to pain symptoms. Goal status: New   2. Patient will demonstrate independent use of home exercise program to facilitate ability to maintain/progress functional gains from skilled physical therapy services. Goal status: New   3. Patient will demonstrate Patient specific functional scale avg > or = *** to indicate reduced disability due to condition.  Goal status: New   4.  Patient will demonstrate *** UE MMT 5/5 throughout to  facilitate lifting, reaching, carrying at [facility] in daily activity.   Goal status: New   5.  Patient will demonstrate *** GH joint AROM WFL s symptoms to facilitate usual overhead reaching, self care, dressing at PLOF.    Goal status: New   6.  *** Goal status: New   7.  *** Goal Status: New  PLAN:  PT FREQUENCY: 1-2x/week  PT DURATION: 10 weeks  PLANNED INTERVENTIONS: Can include 02853- PT Re-evaluation, 97110-Therapeutic exercises, 97530- Therapeutic activity, [id number]- Neuromuscular re-education, 97535- Self Care, 97140- Manual therapy, [phone]- Gait training, [phone]- Orthotic Fit/training, [phone]- Canalith repositioning, [id number]- Aquatic Therapy, [phone]- Electrical stimulation (unattended), [id number] Physical performance testing, 97016- Vasopneumatic device, [id number]- Ultrasound, [id number]- Traction (mechanical), [id number]- Ionotophoresis 4mg /ml Dexamethasone,  20560 - Needle insertion w/o injection 1 or 2 muscles, 20561 - Needle insertion w/o injection 3 or more muscles.   Patient/Family education, Balance training, Stair training, Taping, Dry Needling, Joint mobilization, Joint manipulation, Spinal manipulation, Spinal mobilization, Scar mobilization, Vestibular training, Visual/preceptual remediation/compensation, DME instructions, Cryotherapy, and Moist heat.  All performed as medically necessary.  All included unless contraindicated  PLAN FOR NEXT SESSION: Review HEP knowledge/results.     Ozell Silvan, PT, DPT, OCS, ATC 10/07/24  1:25 PM

## 2024-10-08 ENCOUNTER — Ambulatory Visit: Admitting: Rehabilitative and Restorative Service Providers"
